# Patient Record
Sex: Female | Born: 1990 | Race: White | Hispanic: No | Marital: Single | Smoking: Current every day smoker
Health system: Southern US, Community
[De-identification: ages and names within clinical notes are randomized; demographics above are authoritative.]

## PROBLEM LIST (undated history)

## (undated) DIAGNOSIS — F32A Depression, unspecified: Secondary | ICD-10-CM

## (undated) DIAGNOSIS — E039 Hypothyroidism, unspecified: Secondary | ICD-10-CM

## (undated) DIAGNOSIS — F419 Anxiety disorder, unspecified: Secondary | ICD-10-CM

## (undated) HISTORY — DX: Depression, unspecified: F32.A

## (undated) HISTORY — DX: Hypothyroidism, unspecified: E03.9

## (undated) HISTORY — PX: WISDOM TOOTH EXTRACTION: SHX21

## (undated) HISTORY — DX: Anxiety disorder, unspecified: F41.9

---

## 2016-09-21 DIAGNOSIS — E039 Hypothyroidism, unspecified: Secondary | ICD-10-CM | POA: Insufficient documentation

## 2016-10-31 DIAGNOSIS — J302 Other seasonal allergic rhinitis: Secondary | ICD-10-CM | POA: Insufficient documentation

## 2016-10-31 DIAGNOSIS — G4700 Insomnia, unspecified: Secondary | ICD-10-CM | POA: Insufficient documentation

## 2017-04-07 DIAGNOSIS — Z720 Tobacco use: Secondary | ICD-10-CM | POA: Insufficient documentation

## 2019-01-29 DIAGNOSIS — Z6831 Body mass index (BMI) 31.0-31.9, adult: Secondary | ICD-10-CM | POA: Insufficient documentation

## 2019-01-29 HISTORY — DX: Body mass index (BMI) 31.0-31.9, adult: Z68.31

## 2020-01-30 DIAGNOSIS — E559 Vitamin D deficiency, unspecified: Secondary | ICD-10-CM | POA: Insufficient documentation

## 2020-10-22 ENCOUNTER — Other Ambulatory Visit: Payer: Self-pay

## 2020-10-22 ENCOUNTER — Emergency Department
Admission: EM | Admit: 2020-10-22 | Discharge: 2020-10-22 | Disposition: A | Discharge status: Home / Self Care | Payer: 59 | Source: Home / Self Care

## 2020-10-22 DIAGNOSIS — H9202 Otalgia, left ear: Secondary | ICD-10-CM

## 2020-10-22 DIAGNOSIS — H66002 Acute suppurative otitis media without spontaneous rupture of ear drum, left ear: Secondary | ICD-10-CM

## 2020-10-22 MED ORDER — AMOXICILLIN-POT CLAVULANATE 875-125 MG PO TABS: 1.0000 | ORAL_TABLET | Freq: Two times a day (BID) | ORAL | 0 refills | Status: AC

## 2020-10-22 NOTE — ED Triage Notes (Signed)
Patient presents to Urgent Care with complaints of left ear pain since last night. Patient reports she thinks she has an ear infection, denies changes in hearing.

## 2020-10-22 NOTE — ED Provider Notes (Signed)
[facility]   [id number] 10/22/20 Arrival Time: 7124  CC: EAR PAIN  SUBJECTIVE: History from: patient.  Haley Perez is a 30 y.o. female who presents with of left ear pain since last night. Denies a precipitating event, such as swimming or wearing ear plugs. Patient states the pain is constant and achy in character. Reports history of ear infections in the past. Patient has not taken OTC medications for this. Symptoms are made worse with lying down. Reports similar symptoms in the past. Denies fever, chills, fatigue, sinus pain, rhinorrhea, ear discharge, sore throat, SOB, wheezing, chest pain, nausea, changes in bowel or bladder habits.    ROS: As per HPI.  All other pertinent ROS negative.     History reviewed. No pertinent past medical history. History reviewed. No pertinent surgical history. No Known Allergies No current facility-administered medications on file prior to encounter.   Current Outpatient Medications on File Prior to Encounter  Medication Sig Dispense Refill  . thyroid (ARMOUR) 60 MG tablet Take 60 mg by mouth daily before breakfast. Takes 75 mg. 60mg  + 15mg  daily     Social History   Socioeconomic History  . Marital status: Single    Spouse name: Not on file  . Number of children: Not on file  . Years of education: Not on file  . Highest education level: Not on file  Occupational History  . Not on file  Tobacco Use  . Smoking status: Current Every Day Smoker    Packs/day: 0.30    Types: Cigarettes  . Smokeless tobacco: Never Used  Substance and Sexual Activity  . Alcohol use: Yes    Comment: weekly  . Drug use: Not on file  . Sexual activity: Not on file  Other Topics Concern  . Not on file  Social History Narrative  . Not on file   Social Determinants of Health   Financial Resource Strain: Not on file  Food Insecurity: Not on file  Transportation Needs: Not on file  Physical Activity: Not on file  Stress: Not on file  Social  Connections: Not on file  Intimate Partner Violence: Not on file   Family History  Problem Relation Age of Onset  . Thyroid disease Mother   . Healthy Father     OBJECTIVE:  Vitals:   10/22/20 0931  BP: 129/84  Pulse: 92  Resp: 16  Temp: 98.1 F (36.7 C)  TempSrc: Oral  SpO2: 99%     General appearance: alert; appears fatigued HEENT: Ears: EACs clear, R TM pearly gray with visible cone of light, without erythema, L TM erythematous, bulging, with effusion; Eyes: PERRL, EOMI grossly; Sinuses nontender to palpation; Nose: clear rhinorrhea; Throat: oropharynx mildly erythematous, tonsils 1+ without white tonsillar exudates, uvula midline Neck: supple without LAD Lungs: unlabored respirations, symmetrical air entry; cough: absent; no respiratory distress Heart: regular rate and rhythm.  Radial pulses 2+ symmetrical bilaterally Skin: warm and dry Psychological: alert and cooperative; normal mood and affect  Imaging: No results found.   ASSESSMENT & PLAN:  1. Non-recurrent acute suppurative otitis media of left ear without spontaneous rupture of tympanic membrane   2. Acute otalgia, left     Meds ordered this encounter  Medications  . amoxicillin-clavulanate (AUGMENTIN) 875-125 MG tablet    Sig: Take 1 tablet by mouth 2 (two) times daily for 7 days.    Dispense:  14 tablet    Refill:  0    Order Specific Question:   Supervising Provider  AnswerMerrilee Jansky [[id number]]    Rest and drink plenty of fluids Prescribed augmentin 875 BID for 7 days Take medications as directed and to completion Continue to use OTC ibuprofen and/ or tylenol as needed for pain control Follow up with PCP if symptoms persists Return here or go to the ER if you have any new or worsening symptoms   Reviewed expectations re: course of current medical issues. Questions answered. Outlined signs and symptoms indicating need for more acute intervention. Patient verbalized  understanding. After Visit Summary given.         Moshe Cipro, NP 10/22/20 [phone]

## 2020-10-22 NOTE — Discharge Instructions (Signed)
I have sent in Augmentin for you to take twice a day for 7 days.  Follow up with this office or with primary care if symptoms are persisting.  Follow up in the ER for high fever, trouble swallowing, trouble breathing, other concerning symptoms.  

## 2021-01-09 ENCOUNTER — Encounter: Payer: Self-pay | Admitting: *Deleted

## 2021-01-11 ENCOUNTER — Ambulatory Visit (INDEPENDENT_AMBULATORY_CARE_PROVIDER_SITE_OTHER): Payer: 59

## 2021-01-11 ENCOUNTER — Other Ambulatory Visit (HOSPITAL_COMMUNITY)
Admission: RE | Admit: 2021-01-11 | Discharge: 2021-01-11 | Disposition: A | Discharge status: Home / Self Care | Payer: 59 | Source: Ambulatory Visit

## 2021-01-11 ENCOUNTER — Other Ambulatory Visit: Payer: Self-pay

## 2021-01-11 VITALS — BP 116/76 | HR 93 | Wt 160.0 lb

## 2021-01-11 DIAGNOSIS — Z8659 Personal history of other mental and behavioral disorders: Secondary | ICD-10-CM

## 2021-01-11 DIAGNOSIS — Z113 Encounter for screening for infections with a predominantly sexual mode of transmission: Secondary | ICD-10-CM | POA: Diagnosis not present

## 2021-01-11 DIAGNOSIS — Z01419 Encounter for gynecological examination (general) (routine) without abnormal findings: Secondary | ICD-10-CM | POA: Insufficient documentation

## 2021-01-11 DIAGNOSIS — Z30431 Encounter for routine checking of intrauterine contraceptive device: Secondary | ICD-10-CM | POA: Diagnosis not present

## 2021-01-11 NOTE — Progress Notes (Signed)
Subjective:     Haley Perez is a 30 y.o. female here at [facility] for a well woman exam and to establish care. Current complaints: would like IUD strings checked as she nor her significant other have not been able to feel them. Says that she "had early pregnancy symptoms such as breast tenderness and fatigue. She took 2 UPT's at home, most recent this week, which were both negative. She denies abnormal vaginal bleeding or pelvic/abdominal pain. She also reports that "a mole" recently popped up "at the base of her boyfriends penis" and is concerned if it is HPV. She has not had any outbreaks or lesions or bumps. She recently moved to [place]. Personal health questionnaire reviewed: yes.  Do you have a primary care provider? no Do you feel safe at home? yes    Health Maintenance Due  Topic Date Due  . HIV Screening  Never done  . Hepatitis C Screening  Never done  . PAP-Cervical Cytology Screening  Never done  . PAP SMEAR-Modifier  Never done  . COVID-19 Vaccine (3 - Booster for Pfizer series) 05/23/2020     Risk factors for chronic health problems: No exercise routine Smoking: Yes, 1/2 PPD Alchohol/how much: social Pt BMI: There is no height or weight on file to calculate BMI.   Gynecologic History Patient's last menstrual period was 12/25/2020 (approximate). Contraception: IUD Last Pap: patient thinks was in 2018.  Results were: normal per patient Last mammogram: N/A.  Sex: No issues. Sexually active with 1 female partner.   Obstetric History OB History  Gravida Para Term Preterm AB Living  0 0 0 0 0 0  SAB IAB Ectopic Multiple Live Births  0 0 0 0 0    The following portions of the patient's history were reviewed and updated as appropriate: allergies, current medications, past family history, past medical history, past social history, past surgical history and problem list.  Review of Systems Pertinent items are noted in HPI.    Objective:   BP 116/76   Pulse  93   Wt 160 lb (72.6 kg)   LMP 12/25/2020 (Approximate)  VS reviewed, nursing note reviewed,  Constitutional: well developed, well nourished, no distress HEENT: normocephalic CV: normal rate Pulm/chest wall: normal effort Breast Exam: performed: right breast normal without mass, skin or nipple changes or axillary nodes, left breast normal without mass, skin or nipple changes or axillary nodes Abdomen: soft, non-tender Neuro: alert and oriented x 3 Skin: warm, dry Psych: affect normal Pelvic exam: Performed with chaperone present: Cervix pink, visually closed, without lesion, 2 IUD strings visualized, scant white creamy discharge, vaginal walls and external genitalia normal, pap obtained Bimanual exam: Cervix 0/long/high, firm, anterior, neg CMT, uterus nontender, nonenlarged, adnexa without tenderness, enlargement, or mass     Assessment/Plan:   1. Well woman exam - Normal well woman exam - Pap and GC/CT obtained - Full STI panel was offered but patient declined - Encouraged patient to establish care with PCP to manage anxiety and hypothyroidism. Referral placed - Smoking cessation as well incorporating healthy diet and exercise was discussed and recommended - Recommend boyfriend see PCP to look at "mole". Condom use recommended for STI prevention - Call office if any lesions or bumps appear  - Ambulatory referral to Family Practice - Cytology - PAP( [place])  2. Routine screening for STI (sexually transmitted infection)  - Cytology - PAP( Haley Perez)  3. History of anxiety - Currently on Fluoxetine daily. Reports she feels "much  better".   Follow up in: 1 year for well woman exam or as needed.   Brand Males, CNM  01/11/21 11:22 AM

## 2021-01-14 LAB — CYTOLOGY - PAP
Chlamydia: NEGATIVE
Comment: NEGATIVE
Comment: NORMAL
Diagnosis: NEGATIVE
Neisseria Gonorrhea: NEGATIVE

## 2021-01-22 DIAGNOSIS — F419 Anxiety disorder, unspecified: Secondary | ICD-10-CM | POA: Insufficient documentation

## 2021-02-11 ENCOUNTER — Ambulatory Visit (HOSPITAL_COMMUNITY): Payer: 59 | Admitting: Licensed Clinical Social Worker

## 2021-02-11 NOTE — Progress Notes (Signed)
Therapist contacted patient by My Chart for an assessment and she did not show. Session is a no show

## 2021-03-13 ENCOUNTER — Other Ambulatory Visit: Payer: Self-pay

## 2021-03-13 ENCOUNTER — Encounter: Payer: Self-pay | Admitting: Nurse Practitioner

## 2021-03-13 ENCOUNTER — Ambulatory Visit: Payer: 59 | Admitting: Nurse Practitioner

## 2021-03-13 VITALS — BP 110/66 | HR 93 | Temp 98.2°F | Ht 61.8 in | Wt 163.6 lb

## 2021-03-13 DIAGNOSIS — R5383 Other fatigue: Secondary | ICD-10-CM | POA: Diagnosis not present

## 2021-03-13 DIAGNOSIS — K5909 Other constipation: Secondary | ICD-10-CM

## 2021-03-13 DIAGNOSIS — E559 Vitamin D deficiency, unspecified: Secondary | ICD-10-CM | POA: Diagnosis not present

## 2021-03-13 DIAGNOSIS — K648 Other hemorrhoids: Secondary | ICD-10-CM

## 2021-03-13 DIAGNOSIS — G4700 Insomnia, unspecified: Secondary | ICD-10-CM | POA: Diagnosis not present

## 2021-03-13 DIAGNOSIS — Z114 Encounter for screening for human immunodeficiency virus [HIV]: Secondary | ICD-10-CM

## 2021-03-13 DIAGNOSIS — E039 Hypothyroidism, unspecified: Secondary | ICD-10-CM

## 2021-03-13 MED ORDER — BELSOMRA 5 MG PO TABS: 1.0000 | ORAL_TABLET | Freq: Every evening | ORAL | 3 refills | Status: DC | PRN

## 2021-03-13 NOTE — Progress Notes (Signed)
I,Katawbba Wiggins,acting as a Neurosurgeon for [employer], FNP.,have documented all relevant documentation on the behalf of Arnette Felts, FNP,as directed by  Arnette Felts, FNP while in the presence of Arnette Felts, FNP.   This visit occurred during the SARS-CoV-2 public health emergency.  Safety protocols were in place, including screening questions prior to the visit, additional usage of staff PPE, and extensive cleaning of exam room while observing appropriate contact time as indicated for disinfecting solutions.  Subjective:     Patient ID: Haley Perez , female    DOB: 1991-08-22 , 30 y.o.   MRN: [id number]   Chief Complaint  Patient presents with   Hypothyroidism   Establish Care   Anxiety   Insomnia   Hemorrhoids    HPI  The patient is here today for an evaluation on insomnia, anxiety, hypothyroidism, and hemorrhoids.  She relocated here from [facility] in the Fall 2021, she is originally from [place].  She works for an [employer].  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 Lives with her boyfriend. No children.    She has been on fluoxetine since May  She gained 60 lbs in a year with the thyroid issues. She is taking her thyroid medications by itself.   She has been sleeping and staying in bed at least 10 hours a day and is taking naps, she generally has problems sleeping. She does drink caffeine in the mornings at least 2 cups.  She is also having discomfort to her low abdomen area. Was to see a GI specialist but relocated.  She has also seen Camelia Eng at [facility]  Trazadone, restoril,   Anxiety Patient reports no palpitations.    Thyroid Problem Presents for initial visit. Symptoms include constipation, diarrhea (generally loose), dry skin and fatigue. Patient reports no cold intolerance, palpitations, weight gain or weight loss.    Past Medical History:  Diagnosis Date   Anxiety      Family History  Problem Relation Age  of Onset   Thyroid disease Mother    Healthy Father      Current Outpatient Medications:    ALPRAZolam (XANAX) 0.25 MG tablet, Take 0.25 mg by mouth daily as needed., Disp: , Rfl:    FLUoxetine (PROZAC) 20 MG capsule, Take 1 capsule by mouth daily., Disp: , Rfl:    Levonorgestrel (SKYLA) 13.5 MG IUD, , Disp: , Rfl:    Suvorexant (BELSOMRA) 5 MG TABS, Take 1 tablet by mouth at bedtime as needed., Disp: 30 tablet, Rfl: 3   thyroid (ARMOUR) 15 MG tablet, Take 1 tablet by mouth daily., Disp: , Rfl:    thyroid (ARMOUR) 60 MG tablet, Take 60 mg by mouth daily before breakfast. Takes 75 mg. 60mg  + 15mg  daily, Disp: , Rfl:    Lemborexant (DAYVIGO) 5 MG TABS, Take 1 tablet by mouth at bedtime., Disp: 30 tablet, Rfl: 2   No Known Allergies   Review of Systems  Constitutional:  Positive for fatigue. Negative for weight gain and weight loss.  Respiratory: Negative.    Cardiovascular: Negative.  Negative for palpitations.  Gastrointestinal:  Positive for constipation and diarrhea (generally loose).  Endocrine: Negative for cold intolerance.  Psychiatric/Behavioral: Negative.         She is sleeping a lot.     Today's Vitals   03/13/21 1448  BP: 110/66  Pulse: 93  Temp: 98.2 F (36.8 C)  TempSrc: Oral  Weight: 163 lb 9.6 oz (74.2 kg)  Height: 5' 1.8" (1.57  m)   Body mass index is 30.12 kg/m.  Wt Readings from Last 3 Encounters:  03/13/21 163 lb 9.6 oz (74.2 kg)  01/11/21 160 lb (72.6 kg)    BP Readings from Last 3 Encounters:  03/13/21 110/66  01/11/21 116/76  10/22/20 129/84    Objective:  Physical Exam Vitals reviewed.  Constitutional:      General: She is not in acute distress.    Appearance: Normal appearance.  Cardiovascular:     Rate and Rhythm: Normal rate and regular rhythm.     Pulses: Normal pulses.     Heart sounds: Normal heart sounds. No murmur heard. Pulmonary:     Effort: Pulmonary effort is normal. No respiratory distress.     Breath sounds: Normal breath  sounds. No wheezing.  Abdominal:     General: Abdomen is flat. Bowel sounds are normal. There is no distension.     Palpations: Abdomen is soft.     Tenderness: There is no abdominal tenderness.  Skin:    General: Skin is warm and dry.     Capillary Refill: Capillary refill takes less than 2 seconds.  Neurological:     General: No focal deficit present.     Mental Status: She is alert and oriented to person, place, and time.     Cranial Nerves: No cranial nerve deficit.     Motor: No weakness.  Psychiatric:        Mood and Affect: Mood normal.        Behavior: Behavior normal.        Thought Content: Thought content normal.        Judgment: Judgment normal.        Assessment And Plan:     1. Other fatigue Comments: will check for metabolic causes - VITAMIN D 25 Hydroxy (Vit-D Deficiency, Fractures) - Vitamin B12 - CBC - Iron, TIBC and Ferritin Panel  2. Acquired hypothyroidism Comments: Will check thyroid functions Will make changes to medications as necessary pending lab results - TSH - T4 - T3, free  3. Vitamin D deficiency Will check vitamin D level and supplement as needed.    Also encouraged to spend 15 minutes in the sun daily.   4. Insomnia, unspecified type Will try her on belsomra Discussed side effects and encouraged her to take when she is going to have at least 8 hours of sleep - Suvorexant (BELSOMRA) 5 MG TABS; Take 1 tablet by mouth at bedtime as needed.  Dispense: 30 tablet; Refill: 3  5. Other hemorrhoids None currently encouraged to use suppository as needed She is to also increase her water intake and eat more fiber to avoid constipation  6. Other constipation A high fiber diet with plenty of fluids (up to 8 glasses of water daily) is suggested to relieve these symptoms.  Metamucil, 1 tablespoon once or twice daily can be used to keep bowels regular if needed.  7. Encounter for HIV (human immunodeficiency virus) test - HIV Antibody (routine  testing w rflx)    Patient was given opportunity to ask questions. Patient verbalized understanding of the plan and was able to repeat key elements of the plan. All questions were answered to their satisfaction.  Arnette Felts, FNP    I, Arnette Felts, FNP, have reviewed all documentation for this visit. The documentation on 03/13/21 for the exam, diagnosis, procedures, and orders are all accurate and complete.  IF YOU HAVE BEEN REFERRED TO A SPECIALIST, IT MAY TAKE 1-2 WEEKS TO  SCHEDULE/PROCESS THE REFERRAL. IF YOU HAVE NOT HEARD FROM US/SPECIALIST IN TWO WEEKS, PLEASE GIVE [place] A CALL AT [phone].   THE PATIENT IS ENCOURAGED TO PRACTICE SOCIAL DISTANCING DUE TO THE COVID-19 PANDEMIC.

## 2021-03-14 LAB — VITAMIN D 25 HYDROXY (VIT D DEFICIENCY, FRACTURES): Vit D, 25-Hydroxy: 50.1 ng/mL (ref 30.0–100.0)

## 2021-03-14 LAB — CBC
Hematocrit: 41.2 % (ref 34.0–46.6)
Hemoglobin: 14.1 g/dL (ref 11.1–15.9)
MCH: 31.7 pg (ref 26.6–33.0)
MCHC: 34.2 g/dL (ref 31.5–35.7)
MCV: 93 fL (ref 79–97)
Platelets: 287 10*3/uL (ref 150–450)
RBC: 4.45 x10E6/uL (ref 3.77–5.28)
RDW: 11.7 % (ref 11.7–15.4)
WBC: 8.5 10*3/uL (ref 3.4–10.8)

## 2021-03-14 LAB — IRON,TIBC AND FERRITIN PANEL
Ferritin: 131 ng/mL (ref 15–150)
Iron Saturation: 39 % (ref 15–55)
Iron: 111 ug/dL (ref 27–159)
Total Iron Binding Capacity: 282 ug/dL (ref 250–450)
UIBC: 171 ug/dL (ref 131–425)

## 2021-03-14 LAB — VITAMIN B12: Vitamin B-12: 482 pg/mL (ref 232–1245)

## 2021-03-14 LAB — TSH: TSH: 1.74 u[IU]/mL (ref 0.450–4.500)

## 2021-03-14 LAB — T3, FREE: T3, Free: 3.5 pg/mL (ref 2.0–4.4)

## 2021-03-14 LAB — T4: T4, Total: 5.9 ug/dL (ref 4.5–12.0)

## 2021-03-14 LAB — HIV ANTIBODY (ROUTINE TESTING W REFLEX): HIV Screen 4th Generation wRfx: NONREACTIVE

## 2021-03-26 ENCOUNTER — Other Ambulatory Visit: Payer: Self-pay

## 2021-03-26 MED ORDER — DAYVIGO 5 MG PO TABS: 1.0000 | ORAL_TABLET | Freq: Every day | ORAL | 2 refills | Status: DC

## 2021-07-16 ENCOUNTER — Ambulatory Visit: Payer: 59 | Admitting: Nurse Practitioner

## 2021-07-16 ENCOUNTER — Other Ambulatory Visit: Payer: Self-pay

## 2021-07-16 ENCOUNTER — Encounter: Payer: Self-pay | Admitting: Nurse Practitioner

## 2021-07-16 VITALS — BP 110/80 | HR 94 | Temp 98.1°F | Ht 61.0 in | Wt 165.6 lb

## 2021-07-16 DIAGNOSIS — Z23 Encounter for immunization: Secondary | ICD-10-CM | POA: Diagnosis not present

## 2021-07-16 DIAGNOSIS — E039 Hypothyroidism, unspecified: Secondary | ICD-10-CM | POA: Diagnosis not present

## 2021-07-16 DIAGNOSIS — E559 Vitamin D deficiency, unspecified: Secondary | ICD-10-CM | POA: Diagnosis not present

## 2021-07-16 DIAGNOSIS — R1030 Lower abdominal pain, unspecified: Secondary | ICD-10-CM

## 2021-07-16 LAB — POCT URINALYSIS DIPSTICK
Bilirubin, UA: NEGATIVE
Blood, UA: NEGATIVE
Glucose, UA: NEGATIVE
Nitrite, UA: NEGATIVE
Protein, UA: NEGATIVE
Spec Grav, UA: 1.02 (ref 1.010–1.025)
Urobilinogen, UA: 0.2 E.U./dL
pH, UA: 5.5 (ref 5.0–8.0)

## 2021-07-16 LAB — POCT URINE PREGNANCY: Preg Test, Ur: NEGATIVE

## 2021-07-16 MED ORDER — THYROID 60 MG PO TABS
60.0000 mg | ORAL_TABLET | Freq: Every day | ORAL | 1 refills | Status: DC
Start: 2021-07-16 — End: 2021-12-25

## 2021-07-16 MED ORDER — THYROID 15 MG PO TABS: 15.0000 mg | ORAL_TABLET | Freq: Every day | ORAL | 1 refills | Status: DC

## 2021-07-16 NOTE — Progress Notes (Signed)
I,Haley Perez,acting as a Education administrator for Haley Brine, FNP.,have documented all relevant documentation on the behalf of Haley Brine, FNP,as directed by  Haley Brine, FNP while in the presence of Haley Perez, [place].   This visit occurred during the SARS-CoV-2 public health emergency.  Safety protocols were in place, including screening questions prior to the visit, additional usage of staff PPE, and extensive cleaning of exam room while observing appropriate contact time as indicated for disinfecting solutions.  Subjective:     Patient ID: Haley Perez , female    DOB: Jan 11, 1991 , 30 y.o.   MRN: [id number]   Chief Complaint  Patient presents with   Abdominal Pain     HPI  She reports a dull pain on her right side for more than one week, she states it comes & goes. She noticed this earlier this month. Coughing or sneezing it becomes more of a sharp pain. She does sit all day at work, sitting for too long it does bother her.  When she rotates her hip she has more pain, when she flexes her muscles. She has been taking miralax due to thinking she was constipated. She has an IUD last seen by GYN was earlier this year to check the strings. Heating pad and ibuprofen has been effective. She does have her appendix.     Past Medical History:  Diagnosis Date   Anxiety      Family History  Problem Relation Age of Onset   Thyroid disease Mother    Healthy Father      Current Outpatient Medications:    ALPRAZolam (XANAX) 0.25 MG tablet, Take 0.25 mg by mouth daily as needed., Disp: , Rfl:    Levonorgestrel (SKYLA) 13.5 MG IUD, , Disp: , Rfl:    FLUoxetine (PROZAC) 20 MG capsule, Take 1 capsule by mouth daily. (Patient not taking: Reported on 07/16/2021), Disp: , Rfl:    Lemborexant (DAYVIGO) 5 MG TABS, Take 1 tablet by mouth at bedtime. (Patient not taking: Reported on 07/16/2021), Disp: 30 tablet, Rfl: 2   Suvorexant (BELSOMRA) 5 MG TABS, Take 1 tablet by mouth at bedtime as needed.  (Patient not taking: Reported on 07/16/2021), Disp: 30 tablet, Rfl: 3   thyroid (ARMOUR) 15 MG tablet, Take 1 tablet (15 mg total) by mouth daily., Disp: 90 tablet, Rfl: 1   thyroid (ARMOUR) 60 MG tablet, Take 1 tablet (60 mg total) by mouth daily before breakfast. Takes 75 mg. 69m + 184mdaily, Disp: 90 tablet, Rfl: 1   No Known Allergies   Review of Systems  Constitutional: Negative.   Respiratory: Negative.    Cardiovascular: Negative.   Gastrointestinal:  Positive for abdominal pain (right lower quadrant) and nausea. Negative for constipation and vomiting.  Genitourinary:  Negative for difficulty urinating, dysuria, enuresis, flank pain, frequency and vaginal bleeding.  Neurological: Negative.   Psychiatric/Behavioral: Negative.      Today's Vitals   07/16/21 1425  BP: 110/80  Pulse: 94  Temp: 98.1 F (36.7 C)  Weight: 165 lb 9.6 oz (75.1 kg)  Height: 5' 1"  (1.549 m)  PainSc: 0-No pain   Body mass index is 31.29 kg/m.  Wt Readings from Last 3 Encounters:  07/16/21 165 lb 9.6 oz (75.1 kg)  03/13/21 163 lb 9.6 oz (74.2 kg)  01/11/21 160 lb (72.6 kg)    Objective:  Physical Exam Vitals reviewed.  Constitutional:      General: She is not in acute distress.    Appearance: Normal appearance.  Cardiovascular:     Rate and Rhythm: Normal rate and regular rhythm.     Pulses: Normal pulses.     Heart sounds: Normal heart sounds. No murmur heard. Pulmonary:     Effort: Pulmonary effort is normal. No respiratory distress.     Breath sounds: Normal breath sounds. No wheezing.  Abdominal:     General: Abdomen is flat. Bowel sounds are normal. There is no distension.     Palpations: Abdomen is soft. There is no mass.     Tenderness: There is abdominal tenderness (right lower quadrant). There is no right CVA tenderness, left CVA tenderness or guarding.  Neurological:     General: No focal deficit present.     Mental Status: She is alert and oriented to person, place, and time.      Cranial Nerves: No cranial nerve deficit.     Motor: No weakness.        Assessment And Plan:     1. Pain radiating to lower abdomen Comments: tenderness to right lower abdomen near groin area, will check transvaginal and pelvic ultrasound to check her ovaries. Negative urine pregnancy - POCT Urinalysis Dipstick (94709) - POCT Urine Pregnancy - US Pelvic Complete With Transvaginal; Future - CBC - CMP14+EGFR  2. Acquired hypothyroidism Comments: Will check thyroid levels, refill sent will make changes pending labs - TSH - T4 - T3, free - thyroid (ARMOUR) 60 MG tablet; Take 1 tablet (60 mg total) by mouth daily before breakfast. Takes 75 mg. 40m + 155mdaily  Dispense: 90 tablet; Refill: 1 - thyroid (ARMOUR) 15 MG tablet; Take 1 tablet (15 mg total) by mouth daily.  Dispense: 90 tablet; Refill: 1  3. Vitamin D deficiency Will check vitamin D level and supplement as needed.    Also encouraged to spend 15 minutes in the sun daily.   4. Encounter for immunization Influenza vaccine administered Encouraged to take Tylenol as needed for fever or muscle aches. - Flu Vaccine QUAD 6+ mos PF IM (Fluarix Quad PF)    Patient was given opportunity to ask questions. Patient verbalized understanding of the plan and was able to repeat key elements of the plan. All questions were answered to their satisfaction.  [name]   I, [name], have reviewed all documentation for this visit. The documentation on 07/16/21 for the exam, diagnosis, procedures, and orders are all accurate and complete.   IF YOU HAVE BEEN REFERRED TO A SPECIALIST, IT MAY TAKE 1-2 WEEKS TO SCHEDULE/PROCESS THE REFERRAL. IF YOU HAVE NOT HEARD FROM US/SPECIALIST IN TWO WEEKS, PLEASE GIVE [place] CALL AT [phone] X [phone].   THE PATIENT IS ENCOURAGED TO PRACTICE SOCIAL DISTANCING DUE TO THE COVID-19 PANDEMIC.

## 2021-07-17 LAB — CMP14+EGFR
ALT: 45 IU/L — ABNORMAL HIGH (ref 0–32)
AST: 28 IU/L (ref 0–40)
Albumin/Globulin Ratio: 2 (ref 1.2–2.2)
Albumin: 5.2 g/dL — ABNORMAL HIGH (ref 3.9–5.0)
Alkaline Phosphatase: 94 IU/L (ref 44–121)
BUN/Creatinine Ratio: 13 (ref 9–23)
BUN: 9 mg/dL (ref 6–20)
Bilirubin Total: 1 mg/dL (ref 0.0–1.2)
CO2: 17 mmol/L — ABNORMAL LOW (ref 20–29)
Calcium: 10 mg/dL (ref 8.7–10.2)
Chloride: 102 mmol/L (ref 96–106)
Creatinine, Ser: 0.67 mg/dL (ref 0.57–1.00)
Globulin, Total: 2.6 g/dL (ref 1.5–4.5)
Glucose: 69 mg/dL — ABNORMAL LOW (ref 70–99)
Potassium: 3.7 mmol/L (ref 3.5–5.2)
Sodium: 141 mmol/L (ref 134–144)
Total Protein: 7.8 g/dL (ref 6.0–8.5)
eGFR: 123 mL/min/{1.73_m2} (ref >59)

## 2021-07-17 LAB — CBC
Hematocrit: 45.4 % (ref 34.0–46.6)
Hemoglobin: 15.3 g/dL (ref 11.1–15.9)
MCH: 31.3 pg (ref 26.6–33.0)
MCHC: 33.7 g/dL (ref 31.5–35.7)
MCV: 93 fL (ref 79–97)
Platelets: 264 10*3/uL (ref 150–450)
RBC: 4.89 x10E6/uL (ref 3.77–5.28)
RDW: 12.1 % (ref 11.7–15.4)
WBC: 10 10*3/uL (ref 3.4–10.8)

## 2021-07-17 LAB — T3, FREE: T3, Free: 3 pg/mL (ref 2.0–4.4)

## 2021-07-17 LAB — T4: T4, Total: 6 ug/dL (ref 4.5–12.0)

## 2021-07-17 LAB — TSH: TSH: 5.29 u[IU]/mL — ABNORMAL HIGH (ref 0.450–4.500)

## 2021-07-22 ENCOUNTER — Ambulatory Visit
Admission: RE | Admit: 2021-07-22 | Discharge: 2021-07-22 | Disposition: A | Discharge status: Home / Self Care | Payer: 59 | Source: Ambulatory Visit | Attending: Nurse Practitioner | Admitting: Nurse Practitioner

## 2021-07-22 DIAGNOSIS — R1030 Lower abdominal pain, unspecified: Secondary | ICD-10-CM

## 2021-10-10 ENCOUNTER — Other Ambulatory Visit: Payer: Self-pay

## 2021-10-10 ENCOUNTER — Encounter: Payer: Self-pay | Admitting: Nurse Practitioner

## 2021-10-10 ENCOUNTER — Ambulatory Visit (INDEPENDENT_AMBULATORY_CARE_PROVIDER_SITE_OTHER): Payer: 59 | Admitting: Nurse Practitioner

## 2021-10-10 VITALS — BP 114/66 | HR 55 | Temp 99.1°F | Ht 61.0 in | Wt 176.4 lb

## 2021-10-10 DIAGNOSIS — Z0000 Encounter for general adult medical examination without abnormal findings: Secondary | ICD-10-CM | POA: Diagnosis not present

## 2021-10-10 DIAGNOSIS — Z79899 Other long term (current) drug therapy: Secondary | ICD-10-CM

## 2021-10-10 DIAGNOSIS — E6609 Other obesity due to excess calories: Secondary | ICD-10-CM

## 2021-10-10 DIAGNOSIS — Z124 Encounter for screening for malignant neoplasm of cervix: Secondary | ICD-10-CM

## 2021-10-10 DIAGNOSIS — N926 Irregular menstruation, unspecified: Secondary | ICD-10-CM

## 2021-10-10 DIAGNOSIS — F419 Anxiety disorder, unspecified: Secondary | ICD-10-CM | POA: Diagnosis not present

## 2021-10-10 DIAGNOSIS — F5101 Primary insomnia: Secondary | ICD-10-CM | POA: Diagnosis not present

## 2021-10-10 DIAGNOSIS — E039 Hypothyroidism, unspecified: Secondary | ICD-10-CM

## 2021-10-10 DIAGNOSIS — E559 Vitamin D deficiency, unspecified: Secondary | ICD-10-CM | POA: Diagnosis not present

## 2021-10-10 DIAGNOSIS — Z6833 Body mass index (BMI) 33.0-33.9, adult: Secondary | ICD-10-CM

## 2021-10-10 DIAGNOSIS — Z01419 Encounter for gynecological examination (general) (routine) without abnormal findings: Secondary | ICD-10-CM

## 2021-10-10 MED ORDER — BELSOMRA 10 MG PO TABS: 1.0000 | ORAL_TABLET | Freq: Every day | ORAL | 2 refills | Status: DC | PRN

## 2021-10-10 NOTE — Patient Instructions (Signed)

## 2021-10-10 NOTE — Progress Notes (Signed)
I,Tianna Badgett,acting as a Education administrator for [employer], FNP.,have documented all relevant documentation on the behalf of Haley Brine, FNP,as directed by  Haley Brine, FNP while in the presence of Haley Perez, [place].  This visit occurred during the SARS-CoV-2 public health emergency.  Safety protocols were in place, including screening questions prior to the visit, additional usage of staff PPE, and extensive cleaning of exam room while observing appropriate contact time as indicated for disinfecting solutions.  Subjective:     Patient ID: Haley Perez , female    DOB: 09/16/1990 , 31 y.o.   MRN: [id number]   Chief Complaint  Patient presents with   Annual Exam    HPI  Here for HM. Overall is doing well.     Past Medical History:  Diagnosis Date   Anxiety      Family History  Problem Relation Age of Onset   Thyroid disease Mother    Healthy Father      Current Outpatient Medications:    ALPRAZolam (XANAX) 0.25 MG tablet, Take 0.25 mg by mouth daily as needed., Disp: , Rfl:    Levonorgestrel (SKYLA) 13.5 MG IUD, , Disp: , Rfl:    Suvorexant (BELSOMRA) 10 MG TABS, Take 1 tablet by mouth daily as needed., Disp: 30 tablet, Rfl: 2   thyroid (ARMOUR) 15 MG tablet, Take 1 tablet (15 mg total) by mouth daily., Disp: 90 tablet, Rfl: 1   thyroid (ARMOUR) 60 MG tablet, Take 1 tablet (60 mg total) by mouth daily before breakfast. Takes 75 mg. 62m + 11mdaily, Disp: 90 tablet, Rfl: 1   FLUoxetine (PROZAC) 20 MG capsule, Take 1 capsule by mouth daily. (Patient not taking: Reported on 10/10/2021), Disp: , Rfl:    No Known Allergies    The patient states she uses an IUD for birth control.  Patient's last menstrual period was 09/23/2021 (approximate).. Negative for Dysmenorrhea and Negative for Menorrhagia. Negative for: breast discharge, breast lump(s), breast pain and breast self exam. Associated symptoms include abnormal vaginal bleeding. Pertinent negatives include abnormal bleeding  (hematology), anxiety, decreased libido, depression, difficulty falling sleep, dyspareunia, history of infertility, nocturia, sexual dysfunction, sleep disturbances, urinary incontinence, urinary urgency, vaginal discharge and vaginal itching. Diet regular. The patient states her exercise level is minimal.   The patient's tobacco use is:  Social History   Tobacco Use  Smoking Status Every Day   Packs/day: 0.50   Years: 5.00   Pack years: 2.50   Types: Cigarettes  Smokeless Tobacco Never   She has been exposed to passive smoke. The patient's alcohol use is:  Social History   Substance and Sexual Activity  Alcohol Use Yes   Comment: socially every couple weeks   Additional information: Last pap 01/11/2021, next one scheduled for 2025.    Review of Systems  Constitutional: Negative.   HENT: Negative.    Eyes: Negative.   Respiratory: Negative.    Cardiovascular: Negative.   Gastrointestinal: Negative.   Endocrine: Negative.   Genitourinary: Negative.   Musculoskeletal: Negative.   Skin: Negative.   Allergic/Immunologic: Negative.   Neurological: Negative.   Hematological: Negative.   Psychiatric/Behavioral: Negative.      Today's Vitals   10/10/21 1048  BP: 114/66  Pulse: (!) 55  Temp: 99.1 F (37.3 C)  Weight: 176 lb 6.4 oz (80 kg)  Height: 5' 1" (1.549 m)   Body mass index is 33.33 kg/m.  Wt Readings from Last 3 Encounters:  10/10/21 176 lb 6.4 oz (80 kg)  07/16/21 165 lb 9.6 oz (75.1 kg)  03/13/21 163 lb 9.6 oz (74.2 kg)    BP Readings from Last 3 Encounters:  10/10/21 114/66  07/16/21 110/80  03/13/21 110/66    Objective:  Physical Exam Constitutional:      General: She is not in acute distress.    Appearance: Normal appearance. She is well-developed. She is obese.  HENT:     Head: Normocephalic and atraumatic.     Right Ear: Hearing, tympanic membrane, ear canal and external ear normal. There is no impacted cerumen.     Left Ear: Hearing, tympanic  membrane, ear canal and external ear normal. There is no impacted cerumen.     Nose:     Comments: Deferred - masked    Mouth/Throat:     Comments: Deferred - masked Eyes:     General: Lids are normal.     Extraocular Movements: Extraocular movements intact.     Conjunctiva/sclera: Conjunctivae normal.     Pupils: Pupils are equal, round, and reactive to light.     Funduscopic exam:    Right eye: No papilledema.        Left eye: No papilledema.  Neck:     Thyroid: No thyroid mass.     Vascular: No carotid bruit.  Cardiovascular:     Rate and Rhythm: Normal rate and regular rhythm.     Pulses: Normal pulses.     Heart sounds: Normal heart sounds. No murmur heard. Pulmonary:     Effort: Pulmonary effort is normal.     Breath sounds: Normal breath sounds.  Chest:     Chest wall: No mass.  Breasts:    Tanner Score is 5.     Right: Normal. No mass or tenderness.     Left: Normal. No mass or tenderness.  Abdominal:     General: Abdomen is flat. Bowel sounds are normal. There is no distension.     Palpations: Abdomen is soft.     Tenderness: There is no abdominal tenderness.  Genitourinary:    Comments: Deferred - will refer to GYN  Musculoskeletal:        General: No swelling. Normal range of motion.     Cervical back: Full passive range of motion without pain, normal range of motion and neck supple.     Right lower leg: No edema.     Left lower leg: No edema.  Lymphadenopathy:     Upper Body:     Right upper body: No supraclavicular, axillary or pectoral adenopathy.     Left upper body: No supraclavicular, axillary or pectoral adenopathy.  Skin:    General: Skin is warm and dry.     Capillary Refill: Capillary refill takes less than 2 seconds.  Neurological:     General: No focal deficit present.     Mental Status: She is alert and oriented to person, place, and time.     Cranial Nerves: No cranial nerve deficit.     Sensory: No sensory deficit.  Psychiatric:         Mood and Affect: Mood normal.        Behavior: Behavior normal.        Thought Content: Thought content normal.        Judgment: Judgment normal.        Assessment And Plan:     1. Encounter for annual physical exam Behavior modifications discussed and diet history reviewed.   Pt will continue to exercise regularly and  modify diet with low GI, plant based foods and decrease intake of processed foods.  Recommend intake of daily multivitamin, Vitamin D, and calcium.  Recommend for preventive screenings,  discussed monthly self breast exams as well as recommend immunizations that include influenza, TDAP (up to date) - Hemoglobin A1c - CMP14+EGFR - Lipid panel  2. Encounter for gynecological examination - Ambulatory referral to Obstetrics / Gynecology  3. Class 1 obesity due to excess calories without serious comorbidity with body mass index (BMI) of 33.0 to 33.9 in adult Chronic Discussed healthy diet and regular exercise options  Encouraged to exercise at least 150 minutes per week with 2 days of strength training  4. Acquired hypothyroidism Continue current medications pending labs. - TSH - T3, free - T4  5. Vitamin D deficiency Will check vitamin D level and supplement as needed.    Also encouraged to spend 15 minutes in the sun daily.  - VITAMIN D 25 Hydroxy (Vit-D Deficiency, Fractures)  6. Anxiety Comments: Stable, no concerns at this time  7. Other long term (current) drug therapy - CBC  8. Abnormal menses Comments: She has an IUD, was slightly lighter last month - POCT Urine Pregnancy  9. Primary insomnia Comments: She has not picked up Belsomra, samples were given and Rx sent to pharmacy.  - Suvorexant (BELSOMRA) 10 MG TABS; Take 1 tablet by mouth daily as needed.  Dispense: 30 tablet; Refill: 2 She is encouraged to strive for BMI less than 30 to decrease cardiac risk. Advised to aim for at least 150 minutes of exercise per week.     Patient was given  opportunity to ask questions. Patient verbalized understanding of the plan and was able to repeat key elements of the plan. All questions were answered to their satisfaction.   Haley Brine, FNP   I, Haley Brine, FNP, have reviewed all documentation for this visit. The documentation on 10/10/21 for the exam, diagnosis, procedures, and orders are all accurate and complete.  THE PATIENT IS ENCOURAGED TO PRACTICE SOCIAL DISTANCING DUE TO THE COVID-19 PANDEMIC.

## 2021-10-11 LAB — CMP14+EGFR
ALT: 51 IU/L — ABNORMAL HIGH (ref 0–32)
AST: 25 IU/L (ref 0–40)
Albumin/Globulin Ratio: 2.2 (ref 1.2–2.2)
Albumin: 5 g/dL (ref 3.9–5.0)
Alkaline Phosphatase: 94 IU/L (ref 44–121)
BUN/Creatinine Ratio: 18 (ref 9–23)
BUN: 13 mg/dL (ref 6–20)
Bilirubin Total: 0.4 mg/dL (ref 0.0–1.2)
CO2: 19 mmol/L — ABNORMAL LOW (ref 20–29)
Calcium: 9.8 mg/dL (ref 8.7–10.2)
Chloride: 103 mmol/L (ref 96–106)
Creatinine, Ser: 0.71 mg/dL (ref 0.57–1.00)
Globulin, Total: 2.3 g/dL (ref 1.5–4.5)
Glucose: 74 mg/dL (ref 70–99)
Potassium: 4.3 mmol/L (ref 3.5–5.2)
Sodium: 141 mmol/L (ref 134–144)
Total Protein: 7.3 g/dL (ref 6.0–8.5)
eGFR: 119 mL/min/{1.73_m2} (ref >59)

## 2021-10-11 LAB — CBC
Hematocrit: 45.9 % (ref 34.0–46.6)
Hemoglobin: 15.7 g/dL (ref 11.1–15.9)
MCH: 32.1 pg (ref 26.6–33.0)
MCHC: 34.2 g/dL (ref 31.5–35.7)
MCV: 94 fL (ref 79–97)
Platelets: 301 10*3/uL (ref 150–450)
RBC: 4.89 x10E6/uL (ref 3.77–5.28)
RDW: 11.9 % (ref 11.7–15.4)
WBC: 11.7 10*3/uL — ABNORMAL HIGH (ref 3.4–10.8)

## 2021-10-11 LAB — LIPID PANEL
Chol/HDL Ratio: 3.1 ratio (ref 0.0–4.4)
Cholesterol, Total: 191 mg/dL (ref 100–199)
HDL: 61 mg/dL (ref >39)
LDL Chol Calc (NIH): 115 mg/dL — ABNORMAL HIGH (ref 0–99)
Triglycerides: 85 mg/dL (ref 0–149)
VLDL Cholesterol Cal: 15 mg/dL (ref 5–40)

## 2021-10-11 LAB — T3, FREE: T3, Free: 4.9 pg/mL — ABNORMAL HIGH (ref 2.0–4.4)

## 2021-10-11 LAB — VITAMIN D 25 HYDROXY (VIT D DEFICIENCY, FRACTURES): Vit D, 25-Hydroxy: 33.4 ng/mL (ref 30.0–100.0)

## 2021-10-11 LAB — T4: T4, Total: 5.6 ug/dL (ref 4.5–12.0)

## 2021-10-11 LAB — HEMOGLOBIN A1C
Est. average glucose Bld gHb Est-mCnc: 103 mg/dL
Hgb A1c MFr Bld: 5.2 % (ref 4.8–5.6)

## 2021-10-11 LAB — TSH: TSH: 7.68 u[IU]/mL — ABNORMAL HIGH (ref 0.450–4.500)

## 2021-10-14 ENCOUNTER — Other Ambulatory Visit: Payer: Self-pay | Admitting: Nurse Practitioner

## 2021-10-14 DIAGNOSIS — E039 Hypothyroidism, unspecified: Secondary | ICD-10-CM

## 2021-10-22 ENCOUNTER — Other Ambulatory Visit: Payer: Self-pay

## 2021-10-22 ENCOUNTER — Encounter: Payer: Self-pay | Admitting: Emergency Medicine

## 2021-10-22 ENCOUNTER — Ambulatory Visit
Admission: EM | Admit: 2021-10-22 | Discharge: 2021-10-22 | Disposition: A | Discharge status: Home / Self Care | Payer: 59 | Attending: Physician Assistant | Admitting: Physician Assistant

## 2021-10-22 DIAGNOSIS — H9202 Otalgia, left ear: Secondary | ICD-10-CM | POA: Diagnosis not present

## 2021-10-22 MED ORDER — AMOXICILLIN 500 MG PO CAPS: 500.0000 mg | ORAL_CAPSULE | Freq: Three times a day (TID) | ORAL | 0 refills | Status: DC

## 2021-10-22 NOTE — ED Triage Notes (Signed)
Patient c/o left ear pain since this am.  No other sx's.  Patient denies any other sx's.

## 2021-10-22 NOTE — ED Provider Notes (Signed)
Haley Perez    CSN: [id number] Arrival date & time: 10/22/21  0858      History   Chief Complaint Chief Complaint  Patient presents with   Otalgia    HPI Haley Perez is a 31 y.o. female.   Patient here today for evaluation of left ear pain that started this morning.  She states it feels as if her eardrum has already "busted".  She denies any other symptoms.  She has not had any fever.  She denies any pain in her right ear.  She does not report treatment for symptoms.  The history is provided by the patient.  Otalgia Associated symptoms: no congestion, no cough, no diarrhea, no fever, no sore throat and no vomiting    Past Medical History:  Diagnosis Date   Anxiety     Patient Active Problem List   Diagnosis Date Noted   Anxiety 01/22/2021   BMI 31.0-31.9,adult 01/29/2019   Insomnia 10/31/2016   Hypothyroidism (acquired) 09/21/2016    Past Surgical History:  Procedure Laterality Date   WISDOM TOOTH EXTRACTION      OB History     Gravida  0   Para  0   Term  0   Preterm  0   AB  0   Living  0      SAB  0   IAB  0   Ectopic  0   Multiple  0   Live Births  0            Home Medications    Prior to Admission medications   Medication Sig Start Date End Date Taking? Authorizing Provider  ALPRAZolam (XANAX) 0.25 MG tablet Take 0.25 mg by mouth daily as needed. 12/06/20  Yes [provider]  amoxicillin (AMOXIL) 500 MG capsule Take 1 capsule (500 mg total) by mouth 3 (three) times daily. 10/22/21  Yes Tomi Bamberger, PA-C  Levonorgestrel (SKYLA) 13.5 MG IUD  10/23/16  Yes [provider]  Suvorexant (BELSOMRA) 10 MG TABS Take 1 tablet by mouth daily as needed. 10/10/21  Yes Arnette Felts, FNP  thyroid (ARMOUR) 15 MG tablet Take 1 tablet (15 mg total) by mouth daily. 07/16/21  Yes Arnette Felts, FNP  thyroid (ARMOUR) 60 MG tablet Take 1 tablet (60 mg total) by mouth daily before breakfast. Takes 75 mg. 60mg  + 15mg   daily 07/16/21  Yes , FNP  FLUoxetine (PROZAC) 20 MG capsule Take 1 capsule by mouth daily. Patient not taking: Reported on 10/10/2021 12/07/20 12/26/21  [provider]    Family History Family History  Problem Relation Age of Onset   Thyroid disease Mother    Healthy Father     Social History Social History   Tobacco Use   Smoking status: Every Day    Packs/day: 0.50    Years: 5.00    Pack years: 2.50    Types: Cigarettes   Smokeless tobacco: Never  Vaping Use   Vaping Use: Never used  Substance Use Topics   Alcohol use: Yes    Comment: socially every couple weeks   Drug use: Never     Allergies   Patient has no known allergies.   Review of Systems Review of Systems  Constitutional:  Negative for chills and fever.  HENT:  Positive for ear pain. Negative for congestion and sore throat.   Eyes:  Negative for discharge and redness.  Respiratory:  Negative for cough and shortness of breath.   Gastrointestinal:  Negative for diarrhea, nausea and vomiting.    Physical Exam Triage Vital Signs ED Triage Vitals [10/22/21 1021]  Enc Vitals Group     BP 109/71     Pulse Rate 92     Resp      Temp 98.3 F (36.8 C)     Temp Source Oral     SpO2 98 %     Weight 170 lb (77.1 kg)     Height 5' (1.524 m)     Head Circumference      Peak Flow      Pain Score 4     Pain Loc      Pain Edu?      Excl. in GC?    No data found.  Updated Vital Signs BP 109/71 (BP Location: Left Arm)    Pulse 92    Temp 98.3 F (36.8 C) (Oral)    Ht 5' (1.524 m)    Wt 170 lb (77.1 kg)    LMP 09/23/2021 (Approximate)    SpO2 98%    BMI 33.20 kg/m   Visual Acuity Right Eye Distance:   Left Eye Distance:   Bilateral Distance:    Right Eye Near:   Left Eye Near:    Bilateral Near:     Physical Exam Vitals and nursing note reviewed.  Constitutional:      General: She is not in acute distress.    Appearance: Normal appearance. She is not ill-appearing.  HENT:      Head: Normocephalic and atraumatic.     Right Ear: Tympanic membrane normal.     Ears:     Comments: Unable to fully visualize left TM due to small amount of cerumen in EAC    Nose: Congestion present.  Eyes:     Conjunctiva/sclera: Conjunctivae normal.  Cardiovascular:     Rate and Rhythm: Normal rate.  Pulmonary:     Effort: Pulmonary effort is normal. No respiratory distress.  Skin:    General: Skin is warm and dry.  Neurological:     Mental Status: She is alert.  Psychiatric:        Mood and Affect: Mood normal.        Thought Content: Thought content normal.     UC Treatments / Results  Labs (all labs ordered are listed, but only abnormal results are displayed) Labs Reviewed - No data to display  EKG   Radiology No results found.  Procedures Procedures (including critical Perez time)  Medications Ordered in UC Medications - No data to display  Initial Impression / Assessment and Plan / UC Course  I have reviewed the triage vital signs and the nursing notes.  Pertinent labs & imaging results that were available during my Perez of the patient were reviewed by me and considered in my medical decision making (see chart for details).  Amoxicillin prescribed for left ear pain given inability to fully visualize TM.  Recommended follow-up if symptoms fail to improve or worsen.  Patient expresses understanding.   Final Clinical Impressions(s) / UC Diagnoses   Final diagnoses:  Left ear pain   Discharge Instructions   None    ED Prescriptions     Medication Sig Dispense Auth. Provider   amoxicillin (AMOXIL) 500 MG capsule Take 1 capsule (500 mg total) by mouth 3 (three) times daily. 21 capsule Tomi Bamberger, PA-C      PDMP not reviewed this encounter.   Tomi Bamberger, PA-C 10/22/21 1103

## 2021-10-30 ENCOUNTER — Encounter: Payer: Self-pay | Admitting: Nurse Practitioner

## 2021-10-31 ENCOUNTER — Other Ambulatory Visit: Payer: Self-pay | Admitting: Nurse Practitioner

## 2021-10-31 DIAGNOSIS — R232 Flushing: Secondary | ICD-10-CM

## 2021-11-04 ENCOUNTER — Other Ambulatory Visit: Payer: Self-pay

## 2021-11-04 ENCOUNTER — Other Ambulatory Visit: Payer: 59

## 2021-11-04 DIAGNOSIS — E039 Hypothyroidism, unspecified: Secondary | ICD-10-CM

## 2021-11-04 DIAGNOSIS — R232 Flushing: Secondary | ICD-10-CM

## 2021-11-12 LAB — ESTRADIOL, FREE
Estradiol, Serum, MS: 37 pg/mL
Free Estradiol, Percent: 2.1 %
Free Estradiol, Serum: 0.78 pg/mL

## 2021-11-12 LAB — T3, FREE: T3, Free: 6.1 pg/mL — ABNORMAL HIGH (ref 2.0–4.4)

## 2021-11-12 LAB — TSH: TSH: 3.05 u[IU]/mL (ref 0.450–4.500)

## 2021-11-12 LAB — T4: T4, Total: 7.2 ug/dL (ref 4.5–12.0)

## 2021-11-15 ENCOUNTER — Other Ambulatory Visit: Payer: Self-pay

## 2021-11-15 NOTE — Telephone Encounter (Signed)
Prior auth done for Belsomra 10mg , waiting on a response from the [employer].  ?

## 2021-11-16 ENCOUNTER — Other Ambulatory Visit: Payer: Self-pay | Admitting: Nurse Practitioner

## 2021-12-24 ENCOUNTER — Encounter: Payer: Self-pay | Admitting: Nurse Practitioner

## 2021-12-24 ENCOUNTER — Ambulatory Visit: Payer: 59 | Admitting: Nurse Practitioner

## 2021-12-24 VITALS — BP 128/70 | HR 94 | Temp 98.4°F | Ht 60.0 in | Wt 168.0 lb

## 2021-12-24 DIAGNOSIS — E6609 Other obesity due to excess calories: Secondary | ICD-10-CM

## 2021-12-24 DIAGNOSIS — R232 Flushing: Secondary | ICD-10-CM | POA: Diagnosis not present

## 2021-12-24 DIAGNOSIS — R7989 Other specified abnormal findings of blood chemistry: Secondary | ICD-10-CM

## 2021-12-24 DIAGNOSIS — E039 Hypothyroidism, unspecified: Secondary | ICD-10-CM

## 2021-12-24 DIAGNOSIS — Z6832 Body mass index (BMI) 32.0-32.9, adult: Secondary | ICD-10-CM

## 2021-12-24 NOTE — Patient Instructions (Signed)

## 2021-12-24 NOTE — Progress Notes (Signed)
I,Tianna Badgett,acting as a Neurosurgeon for [employer], FNP.,have documented all relevant documentation on the behalf of Arnette Felts, FNP,as directed by  Arnette Felts, FNP while in the presence of Arnette Felts, FNP.  This visit occurred during the SARS-CoV-2 public health emergency.  Safety protocols were in place, including screening questions prior to the visit, additional usage of staff PPE, and extensive cleaning of exam room while observing appropriate contact time as indicated for disinfecting solutions.  Subjective:     Patient ID: Haley Perez , female    DOB: 12-May-1991 , 31 y.o.   MRN: [id number]   Chief Complaint  Patient presents with   Thyroid Problem    HPI  Pt presents today for a follow up on thyroid meds. She has been on thyroid armour since prior to coming to this office and thinks she needs to switch to synthroid. She was on levothyroxine which caused more side effects. She is taking 75 mg M-F and skipping weekend  Thyroid Problem Presents for follow-up visit. Symptoms include fatigue. Patient reports no anxiety. (Hot flashes)    Past Medical History:  Diagnosis Date   Anxiety    Hypothyroid      Family History  Problem Relation Age of Onset   Thyroid disease Mother    Healthy Father      Current Outpatient Medications:    ALPRAZolam (XANAX) 0.25 MG tablet, Take 0.25 mg by mouth daily as needed., Disp: , Rfl:    DAYVIGO 5 MG TABS, TAKE 1 TABLET BY MOUTH EVERY DAY AT BEDTIME, Disp: 30 tablet, Rfl: 5   FLUoxetine (PROZAC) 20 MG capsule, Take 1 capsule by mouth daily., Disp: , Rfl:    Levonorgestrel (SKYLA) 13.5 MG IUD, , Disp: , Rfl:    SYNTHROID 88 MCG tablet, Take 1 tablet (88 mcg total) by mouth daily., Disp: 30 tablet, Rfl: 2   No Known Allergies   Review of Systems  Constitutional:  Positive for fatigue.  Respiratory: Negative.    Cardiovascular: Negative.   Neurological: Negative.   Psychiatric/Behavioral: Negative.  The patient is not  nervous/anxious.     Today's Vitals   12/24/21 1546  BP: 128/70  Pulse: 94  Temp: 98.4 F (36.9 C)  TempSrc: Oral  Weight: 168 lb (76.2 kg)  Height: 5' (1.524 m)   Body mass index is 32.81 kg/m.  Wt Readings from Last 3 Encounters:  01/16/22 169 lb (76.7 kg)  12/24/21 168 lb (76.2 kg)  10/22/21 170 lb (77.1 kg)    Objective:  Physical Exam Vitals reviewed.  Constitutional:      General: She is not in acute distress.    Appearance: Normal appearance. She is obese.  Cardiovascular:     Rate and Rhythm: Normal rate and regular rhythm.     Pulses: Normal pulses.     Heart sounds: Normal heart sounds. No murmur heard. Pulmonary:     Effort: Pulmonary effort is normal. No respiratory distress.     Breath sounds: Normal breath sounds. No wheezing.  Musculoskeletal:     Cervical back: Normal range of motion and neck supple. No rigidity or tenderness.  Lymphadenopathy:     Cervical: No cervical adenopathy.  Skin:    General: Skin is warm and dry.     Capillary Refill: Capillary refill takes less than 2 seconds.  Neurological:     General: No focal deficit present.     Mental Status: She is alert and oriented to person, place, and time.  Cranial Nerves: No cranial nerve deficit.     Motor: No weakness.  Psychiatric:        Mood and Affect: Mood normal.        Behavior: Behavior normal.        Thought Content: Thought content normal.        Judgment: Judgment normal.        Assessment And Plan:     1. Acquired hypothyroidism Comments: will consider switching to synthroid pending labs. Samples of 88 mcg synthroid given in event we change  - T4 - T3, free - TSH - US THYROID; Future  2. Hot flashes Comments: May be related to her thyroid will check thyroid levels.   3. Abnormal thyroid blood test Comments: Will send for thyroid ultrasound. No noted palpable nodules - US THYROID; Future  4. Class 1 obesity due to excess calories with serious comorbidity and  body mass index (BMI) of 32.0 to 32.9 in adult  She is encouraged to strive for BMI less than 30 to decrease cardiac risk. Advised to aim for at least 150 minutes of exercise per week.    Patient was given opportunity to ask questions. Patient verbalized understanding of the plan and was able to repeat key elements of the plan. All questions were answered to their satisfaction.  Arnette Felts, FNP   I, Arnette Felts, FNP, have reviewed all documentation for this visit. The documentation on 12/24/21 for the exam, diagnosis, procedures, and orders are all accurate and complete.   IF YOU HAVE BEEN REFERRED TO A SPECIALIST, IT MAY TAKE 1-2 WEEKS TO SCHEDULE/PROCESS THE REFERRAL. IF YOU HAVE NOT HEARD FROM US/SPECIALIST IN TWO WEEKS, PLEASE GIVE [place] A CALL AT [phone] X [phone].   THE PATIENT IS ENCOURAGED TO PRACTICE SOCIAL DISTANCING DUE TO THE COVID-19 PANDEMIC.

## 2021-12-25 ENCOUNTER — Other Ambulatory Visit: Payer: Self-pay | Admitting: Nurse Practitioner

## 2021-12-25 DIAGNOSIS — E039 Hypothyroidism, unspecified: Secondary | ICD-10-CM

## 2021-12-25 LAB — T3, FREE: T3, Free: 3.8 pg/mL (ref 2.0–4.4)

## 2021-12-25 LAB — TSH: TSH: 8.91 u[IU]/mL — ABNORMAL HIGH (ref 0.450–4.500)

## 2021-12-25 LAB — T4: T4, Total: 6.5 ug/dL (ref 4.5–12.0)

## 2021-12-25 MED ORDER — SYNTHROID 88 MCG PO TABS: 88.0000 ug | ORAL_TABLET | Freq: Every day | ORAL | 2 refills | Status: DC

## 2022-01-06 ENCOUNTER — Ambulatory Visit
Admission: RE | Admit: 2022-01-06 | Discharge: 2022-01-06 | Disposition: A | Discharge status: Home / Self Care | Payer: 59 | Source: Ambulatory Visit | Attending: Nurse Practitioner | Admitting: Nurse Practitioner

## 2022-01-06 DIAGNOSIS — R7989 Other specified abnormal findings of blood chemistry: Secondary | ICD-10-CM

## 2022-01-06 DIAGNOSIS — E039 Hypothyroidism, unspecified: Secondary | ICD-10-CM

## 2022-01-09 NOTE — Progress Notes (Signed)
ANNUAL EXAM Patient name: Haley Perez MRN [id number]  Date of birth: [birth year] Chief Complaint:   Annual Exam  History of Present Illness:   Haley Perez is a 31 y.o. G0P0000 female being seen today for a routine annual exam.   Current complaints:   She has some RLQ pain. Has [facility] of endometriosis - no known personal history. Has been going on since getting covid. She has regular BM. No urinary issues. She had a normal [place] in November.  Questions about conceiving in s/p hypothyroidism. Considering TTC in the next year.  One month ago felt like a rubber band snapping during intercourse - was painful. Hasn't felt that since.   Had covid in August/Sept and hasn't felt like usual since.   No LMP recorded. (Menstrual status: IUD).   The pregnancy intention screening data noted above was reviewed. Potential methods of contraception were discussed. The patient elected to proceed with No data recorded.   Last pap 12/2020. Results were: NILM w/ HRHPV not done. H/O abnormal pap: no Health Maintenance Due  Topic Date Due   COVID-19 Vaccine (3 - Pfizer risk series) 01/19/2020        03/13/2021    2:43 PM  Depression screen PHQ 2/9  Decreased Interest 0  Down, Depressed, Hopeless 0  PHQ - 2 Score 0         View : No data to display.           Review of Systems:   Pertinent items are noted in HPI Denies any headaches, blurred vision, fatigue, shortness of breath, chest pain,  abnormal vaginal discharge/itching/odor/irritation, problems with periods, bowel movements, urination, unless otherwise stated above.  Pertinent History Reviewed:  Reviewed past medical,surgical, social and family history.  Reviewed problem list, medications and allergies. Physical Assessment:   Vitals:   01/16/22 0920  BP: 115/80  Pulse: 88  Weight: 169 lb (76.7 kg)  Height: 5' (1.524 m)  Body mass index is 33.01 kg/m.   Physical Examination:  General appearance - well appearing, and in no  distress Mental status - alert, oriented to person, place, and time Psych:  She has a normal mood and affect Skin - warm and dry, normal color, no suspicious lesions noted Chest - effort normal, all lung fields clear to auscultation bilaterally Heart - normal rate and regular rhythm Neck:  midline trachea, no thyromegaly or nodules Breasts - breasts appear normal, no suspicious masses, no skin or nipple changes or  axillary nodes Abdomen - soft, nontender, nondistended, no masses or organomegaly Pelvic -  VULVA: normal appearing vulva with no masses, tenderness or lesions   VAGINA: normal appearing vagina with normal color and discharge, no lesions . Some mild left obturator tenderness CERVIX: normal appearing cervix without discharge or lesions, no CMT, IUD strings visualized.  UTERUS: uterus is felt to be normal size, shape, consistency and nontender  ADNEXA: No adnexal masses or tenderness noted. Extremities:  No swelling or varicosities noted  Chaperone present for exam  No results found for this or any previous visit (from the past 24 hour(s)).  Assessment & Plan:  Neddie was seen today for annual exam.  Diagnoses and all orders for this visit:  Encounter for annual routine gynecological examination - Cervical cancer screening: Discussed screening Q3 years. Reviewed importance of annual exams and limits of pap smear. Pap with reflex HPV normal 12/2020. Due 2025.  - GC/CT: Discussed and recommended. Pt  declines - Birth Control: Continued IUD (skyla) - Breast  Health: Encouraged self breast awareness/exams. Teaching provided. - Follow-up: 12 months and prn  Pelvic floor tension - Discussed exercises would work well for obturator  Encounter for preconception consultation - Current birth control:  IUD - Medical problems:  Hypothyroidism - reviewed impact on fertility, sab, and fetal development and that most risks are mitigated by having good control of thyroid prior to  conception. Reviewed goal TSH of 2.5.  - Concerning social habits: None  - Medications reviewed:  May continue routine meds for now but would not take xanax during the pregnancy.  If still taking the Dayvigo, would discontinue this.  - If not pregnant after 12 months of trying, she should come in for evaluation or 6 months if associated with severe dysmenorrhea  Abdominal pain - Pain is on the right and around the umbilicus. She had normal [place]. Reviewed most likely MSK vs bowel vs possible endometriosis given [facility]. Nevertheless all would give normal [place] and would not advise any other intervention at this time as long as pain remains mild. Reviewed if once IUD out and TTC, if she has severe dysmenorrhea, then dx laparoscopy would be an option after 6 mo of TTC  No orders of the defined types were placed in this encounter.   Meds: No orders of the defined types were placed in this encounter.   Follow-up: Return in about 1 year (around 01/17/2023) for annual.  Milas Hock, MD 01/16/2022 10:44 AM

## 2022-01-16 ENCOUNTER — Encounter: Payer: Self-pay | Admitting: Obstetrics and Gynecology

## 2022-01-16 ENCOUNTER — Ambulatory Visit (INDEPENDENT_AMBULATORY_CARE_PROVIDER_SITE_OTHER): Payer: 59 | Admitting: Obstetrics and Gynecology

## 2022-01-16 VITALS — BP 115/80 | HR 88 | Ht 60.0 in | Wt 169.0 lb

## 2022-01-16 DIAGNOSIS — M6289 Other specified disorders of muscle: Secondary | ICD-10-CM | POA: Diagnosis not present

## 2022-01-16 DIAGNOSIS — Z3169 Encounter for other general counseling and advice on procreation: Secondary | ICD-10-CM

## 2022-01-16 DIAGNOSIS — Z01419 Encounter for gynecological examination (general) (routine) without abnormal findings: Secondary | ICD-10-CM | POA: Diagnosis not present

## 2022-01-16 DIAGNOSIS — R1033 Periumbilical pain: Secondary | ICD-10-CM | POA: Diagnosis not present

## 2022-01-16 NOTE — Patient Instructions (Signed)
The muscle that was tender on the left was the obturator muscle.

## 2022-01-16 NOTE — Progress Notes (Signed)
Pt c/o right side pelvic pain Pt may want to get pregnant soon and has questions concerning hypothyroidism

## 2022-01-21 ENCOUNTER — Other Ambulatory Visit: Payer: Self-pay | Admitting: Nurse Practitioner

## 2022-01-21 DIAGNOSIS — R9389 Abnormal findings on diagnostic imaging of other specified body structures: Secondary | ICD-10-CM

## 2022-01-21 DIAGNOSIS — E039 Hypothyroidism, unspecified: Secondary | ICD-10-CM

## 2022-01-21 DIAGNOSIS — R232 Flushing: Secondary | ICD-10-CM

## 2022-01-30 ENCOUNTER — Other Ambulatory Visit: Payer: Self-pay | Admitting: Nurse Practitioner

## 2022-01-30 ENCOUNTER — Other Ambulatory Visit: Payer: 59

## 2022-01-30 DIAGNOSIS — E039 Hypothyroidism, unspecified: Secondary | ICD-10-CM

## 2022-01-31 LAB — T3, FREE: T3, Free: 2.5 pg/mL (ref 2.0–4.4)

## 2022-01-31 LAB — TSH: TSH: 5.15 u[IU]/mL — ABNORMAL HIGH (ref 0.450–4.500)

## 2022-01-31 LAB — T4: T4, Total: 7.1 ug/dL (ref 4.5–12.0)

## 2022-02-09 ENCOUNTER — Other Ambulatory Visit: Payer: Self-pay | Admitting: Nurse Practitioner

## 2022-02-09 DIAGNOSIS — E039 Hypothyroidism, unspecified: Secondary | ICD-10-CM

## 2022-02-10 ENCOUNTER — Other Ambulatory Visit: Payer: Self-pay | Admitting: Nurse Practitioner

## 2022-02-10 DIAGNOSIS — E039 Hypothyroidism, unspecified: Secondary | ICD-10-CM

## 2022-02-10 MED ORDER — LEVOTHYROXINE SODIUM 88 MCG PO TABS: ORAL_TABLET | ORAL | 2 refills | Status: DC

## 2022-03-26 ENCOUNTER — Telehealth: Payer: Self-pay | Admitting: *Deleted

## 2022-03-26 NOTE — Telephone Encounter (Signed)
Mailbox full

## 2022-04-10 ENCOUNTER — Ambulatory Visit: Payer: 59 | Admitting: Nurse Practitioner

## 2022-04-10 ENCOUNTER — Ambulatory Visit
Admission: RE | Admit: 2022-04-10 | Discharge: 2022-04-10 | Disposition: A | Discharge status: Home / Self Care | Payer: 59 | Source: Ambulatory Visit | Attending: Nurse Practitioner | Admitting: Nurse Practitioner

## 2022-04-10 ENCOUNTER — Encounter: Payer: Self-pay | Admitting: Nurse Practitioner

## 2022-04-10 VITALS — BP 118/66 | HR 101 | Temp 98.7°F | Ht 60.0 in | Wt 167.0 lb

## 2022-04-10 DIAGNOSIS — F17200 Nicotine dependence, unspecified, uncomplicated: Secondary | ICD-10-CM | POA: Diagnosis not present

## 2022-04-10 DIAGNOSIS — E039 Hypothyroidism, unspecified: Secondary | ICD-10-CM | POA: Diagnosis not present

## 2022-04-10 DIAGNOSIS — Z6832 Body mass index (BMI) 32.0-32.9, adult: Secondary | ICD-10-CM

## 2022-04-10 DIAGNOSIS — E559 Vitamin D deficiency, unspecified: Secondary | ICD-10-CM

## 2022-04-10 DIAGNOSIS — E6609 Other obesity due to excess calories: Secondary | ICD-10-CM

## 2022-04-10 DIAGNOSIS — E539 Vitamin B deficiency, unspecified: Secondary | ICD-10-CM

## 2022-04-10 NOTE — Progress Notes (Addendum)
I,Tianna Badgett,acting as a Neurosurgeon for [employer], FNP.,have documented all relevant documentation on the behalf of Arnette Felts, FNP,as directed by  Arnette Felts, FNP while in the presence of Arnette Felts, FNP.  Subjective:     Patient ID: Haley Perez , female    DOB: 06/05/1991 , 31 y.o.   MRN: [id number]   Chief Complaint  Patient presents with   Thyroid Problem    HPI  Pt presents today for a follow up on thyroid meds. She has been training for scuba diving and on Wednesday was in the pool and afterwards felt fatigued. Has pulled herself from the class to ensure she was not sick. Took 2 covid test on Friday and Sunday. Now just feeling "drained" otherwise good. The class is 2 weeks. Next class is September 5th.   Thyroid Problem Presents for follow-up visit. Symptoms include tremors. Patient reports no anxiety, cold intolerance or dry skin. (Hot flashes and will have nausea when she gets overheated. Had improved but has gotten worse. )     Past Medical History:  Diagnosis Date   Anxiety    Hypothyroid      Family History  Problem Relation Age of Onset   Thyroid disease Mother    Healthy Father      Current Outpatient Medications:    ALPRAZolam (XANAX) 0.25 MG tablet, Take 0.25 mg by mouth daily as needed., Disp: , Rfl:    Levonorgestrel (SKYLA) 13.5 MG IUD, , Disp: , Rfl:    levothyroxine (SYNTHROID) 88 MCG tablet, Take 1 tablet by mouth daily Mon- Saturday and 2 tabs on Sunday, Disp: 30 tablet, Rfl: 2   No Known Allergies   Review of Systems  Constitutional: Negative.   Respiratory: Negative.    Cardiovascular: Negative.   Gastrointestinal: Negative.   Endocrine: Negative for cold intolerance.  Neurological:  Positive for tremors.  Psychiatric/Behavioral: Negative.  The patient is not nervous/anxious.      Today's Vitals   04/10/22 0909  BP: 118/66  Pulse: (!) 101  Temp: 98.7 F (37.1 C)  TempSrc: Oral  Weight: 167 lb (75.8 kg)  Height: 5' (1.524 m)    Body mass index is 32.61 kg/m.  Wt Readings from Last 3 Encounters:  04/10/22 167 lb (75.8 kg)  01/16/22 169 lb (76.7 kg)  12/24/21 168 lb (76.2 kg)    Objective:  Physical Exam Vitals reviewed.  Constitutional:      General: She is not in acute distress.    Appearance: Normal appearance. She is obese.  Cardiovascular:     Rate and Rhythm: Normal rate and regular rhythm.     Pulses: Normal pulses.     Heart sounds: Normal heart sounds. No murmur heard. Pulmonary:     Effort: Pulmonary effort is normal. No respiratory distress.     Breath sounds: Normal breath sounds. No wheezing.  Musculoskeletal:     Cervical back: Normal range of motion and neck supple. No rigidity or tenderness.  Lymphadenopathy:     Cervical: No cervical adenopathy.  Skin:    General: Skin is warm and dry.     Capillary Refill: Capillary refill takes less than 2 seconds.  Neurological:     General: No focal deficit present.     Mental Status: She is alert and oriented to person, place, and time.     Cranial Nerves: No cranial nerve deficit.     Motor: No weakness.  Psychiatric:        Mood and Affect: Mood  normal.        Behavior: Behavior normal.        Thought Content: Thought content normal.        Judgment: Judgment normal.         Assessment And Plan:     1. Acquired hypothyroidism Comments: TSH was slightly elevated and is taking levothyroxine 88 mcg Mon-Sat and 2 tabs Sunday. Will recheck thyroid levels - T3, free - T4 - TSH  2. Vitamin D deficiency Will check vitamin D level and supplement as needed.    Also encouraged to spend 15 minutes in the sun daily.  - VITAMIN D 25 Hydroxy (Vit-D Deficiency, Fractures)  3. Vitamin B deficiency Comments: Per patient had low vitamin B12 will check levels reports having some fatigue - Vitamin B12  4. Smoker Comments: Will refer for Pulmonary for pulmonary functions to be cleared for scuba diving certification. Smoking approximately 6  cigarettes a day.  - DG Chest 2 View; Future - Ambulatory referral to Pulmonology  5. Class 1 obesity due to excess calories with serious comorbidity and body mass index (BMI) of 32.0 to 32.9 in adult She is encouraged to strive for BMI less than 30 to decrease cardiac risk. Advised to aim for at least 150 minutes of exercise per week.    Patient was given opportunity to ask questions. Patient verbalized understanding of the plan and was able to repeat key elements of the plan. All questions were answered to their satisfaction.  Arnette Felts, FNP   I, Arnette Felts, FNP, have reviewed all documentation for this visit. The documentation on 04/10/22 for the exam, diagnosis, procedures, and orders are all accurate and complete.   IF YOU HAVE BEEN REFERRED TO A SPECIALIST, IT MAY TAKE 1-2 WEEKS TO SCHEDULE/PROCESS THE REFERRAL. IF YOU HAVE NOT HEARD FROM US/SPECIALIST IN TWO WEEKS, PLEASE GIVE [place] A CALL AT [phone].   THE PATIENT IS ENCOURAGED TO PRACTICE SOCIAL DISTANCING DUE TO THE COVID-19 PANDEMIC.

## 2022-04-10 NOTE — Patient Instructions (Signed)

## 2022-04-11 LAB — VITAMIN D 25 HYDROXY (VIT D DEFICIENCY, FRACTURES): Vit D, 25-Hydroxy: 29.4 ng/mL — ABNORMAL LOW (ref 30.0–100.0)

## 2022-04-11 LAB — T3, FREE: T3, Free: 2.8 pg/mL (ref 2.0–4.4)

## 2022-04-11 LAB — T4: T4, Total: 7.1 ug/dL (ref 4.5–12.0)

## 2022-04-11 LAB — VITAMIN B12: Vitamin B-12: 713 pg/mL (ref 232–1245)

## 2022-04-11 LAB — TSH: TSH: 11.3 u[IU]/mL — ABNORMAL HIGH (ref 0.450–4.500)

## 2022-04-14 ENCOUNTER — Other Ambulatory Visit: Payer: Self-pay | Admitting: Nurse Practitioner

## 2022-04-14 DIAGNOSIS — E039 Hypothyroidism, unspecified: Secondary | ICD-10-CM

## 2022-04-14 MED ORDER — LEVOTHYROXINE SODIUM 100 MCG PO TABS: ORAL_TABLET | ORAL | 1 refills | Status: DC

## 2022-05-19 ENCOUNTER — Other Ambulatory Visit (HOSPITAL_COMMUNITY)
Admission: RE | Admit: 2022-05-19 | Discharge: 2022-05-19 | Disposition: A | Discharge status: Home / Self Care | Payer: 59 | Source: Ambulatory Visit | Attending: Obstetrics and Gynecology | Admitting: Obstetrics and Gynecology

## 2022-05-19 ENCOUNTER — Encounter: Payer: Self-pay | Admitting: Obstetrics and Gynecology

## 2022-05-19 ENCOUNTER — Ambulatory Visit: Payer: 59 | Admitting: Obstetrics and Gynecology

## 2022-05-19 VITALS — BP 122/80 | HR 98 | Ht 60.0 in

## 2022-05-19 DIAGNOSIS — Z113 Encounter for screening for infections with a predominantly sexual mode of transmission: Secondary | ICD-10-CM | POA: Insufficient documentation

## 2022-05-19 DIAGNOSIS — Z30433 Encounter for removal and reinsertion of intrauterine contraceptive device: Secondary | ICD-10-CM | POA: Diagnosis not present

## 2022-05-19 DIAGNOSIS — Z30430 Encounter for insertion of intrauterine contraceptive device: Secondary | ICD-10-CM

## 2022-05-19 LAB — POCT URINE PREGNANCY: Preg Test, Ur: NEGATIVE

## 2022-05-19 MED ORDER — LEVONORGESTREL 20 MCG/DAY IU IUD
1.0000 | INTRAUTERINE_SYSTEM | Freq: Once | INTRAUTERINE | Status: AC
  Administered 2022-05-19: 1 via INTRAUTERINE

## 2022-05-19 NOTE — Progress Notes (Signed)
     IUD REMOVAL & INSERTION PROCEDURE NOTE  Haley Perez is a 31 y.o. G0P0000 here for Skyla removal and Mirena insertion. No GYN concerns.   She was counseled regarding the risks/benefits of IUD including insertion risk of infection, hemorrhage, damage to surrounding tissue and organs, uterine perforation. She was counseled regarding risks of IUD including implantation into uterine wall, malpositioning, misplacement out of the uterus, migration outside of uterus, possible need for hysteroscopic or laparoscopic removal, expulsion. She was advised that risk of pregnancy is low with negative UPT but is not zero and IUD insertion may cause miscarriage. Reviewed that she is also at slightly higher risk for ectopic pregnancy and she should take a pregnancy test if she believes she may be pregnant. She was advised to use backup method of protection for one week. She verbalized understanding of all of the above and consent signed.   Last pap smear was on 01/2021 and was normal  IUD Removal and Insertion  Patient identified and an adequate time out was performed. Patient with normal appearing external female genitalia. Graves speculum placed in vagina and white IUD strings easily visualized. Strings grasped with ring forceps and removed easily. Minimal bleeding noted.   The cervix was cleaned with Betadine x 2 and grasped anteriorly with a single tooth tenaculum.  A uterine sound was used to sound the uterus to 7 cm;  the IUD was then placed per manufacturer's recommendations. Strings trimmed to 3 cm. Tenaculum was removed, good hemostasis noted with pressure. Patient tolerated procedure well.   Patient was given post-procedure instructions. Patient was also asked to check IUD strings periodically and follow up in 4 weeks for IUD check.  Mirena IUD Exp: 10/24/2023 Lot: Nanetta Batty, M.D. Attending Center for [employer] Fish farm manager)

## 2022-05-21 LAB — CERVICOVAGINAL ANCILLARY ONLY
Chlamydia: NEGATIVE
Comment: NEGATIVE
Comment: NORMAL
Neisseria Gonorrhea: NEGATIVE

## 2022-05-23 ENCOUNTER — Other Ambulatory Visit: Payer: Self-pay

## 2022-05-23 DIAGNOSIS — R0602 Shortness of breath: Secondary | ICD-10-CM

## 2022-05-26 ENCOUNTER — Ambulatory Visit (INDEPENDENT_AMBULATORY_CARE_PROVIDER_SITE_OTHER): Payer: 59 | Admitting: Internal Medicine

## 2022-05-26 DIAGNOSIS — R0602 Shortness of breath: Secondary | ICD-10-CM

## 2022-05-26 LAB — PULMONARY FUNCTION TEST
DL/VA % pred: 88 %
DL/VA: 4.16 ml/min/mmHg/L
DLCO cor % pred: 99 %
DLCO cor: 20.85 ml/min/mmHg
DLCO unc % pred: 99 %
DLCO unc: 20.85 ml/min/mmHg
FEF 25-75 Post: 3.74 L/sec
FEF 25-75 Pre: 2.89 L/sec
FEF2575-%Change-Post: 29 %
FEF2575-%Pred-Post: 109 %
FEF2575-%Pred-Pre: 84 %
FEV1-%Change-Post: 7 %
FEV1-%Pred-Post: 110 %
FEV1-%Pred-Pre: 103 %
FEV1-Post: 3.35 L
FEV1-Pre: 3.13 L
FEV1FVC-%Change-Post: 6 %
FEV1FVC-%Pred-Pre: 94 %
FEV6-%Change-Post: 1 %
FEV6-%Pred-Post: 111 %
FEV6-%Pred-Pre: 110 %
FEV6-Post: 3.92 L
FEV6-Pre: 3.88 L
FEV6FVC-%Pred-Post: 100 %
FEV6FVC-%Pred-Pre: 100 %
FVC-%Change-Post: 1 %
FVC-%Pred-Post: 110 %
FVC-%Pred-Pre: 109 %
FVC-Post: 3.92 L
FVC-Pre: 3.88 L
Post FEV1/FVC ratio: 85 %
Post FEV6/FVC ratio: 100 %
Pre FEV1/FVC ratio: 81 %
Pre FEV6/FVC Ratio: 100 %
RV % pred: 120 %
RV: 1.5 L
TLC % pred: 114 %
TLC: 5.44 L

## 2022-05-26 NOTE — Progress Notes (Signed)
Full PFT Performed Today  

## 2022-05-26 NOTE — Patient Instructions (Signed)
Full PFT Performed Today  

## 2022-06-03 ENCOUNTER — Encounter: Payer: Self-pay | Admitting: Nurse Practitioner

## 2022-06-05 ENCOUNTER — Other Ambulatory Visit: Payer: Self-pay | Admitting: Nurse Practitioner

## 2022-06-05 DIAGNOSIS — E039 Hypothyroidism, unspecified: Secondary | ICD-10-CM

## 2022-06-17 ENCOUNTER — Encounter: Payer: Self-pay | Admitting: Obstetrics and Gynecology

## 2022-06-17 ENCOUNTER — Ambulatory Visit (INDEPENDENT_AMBULATORY_CARE_PROVIDER_SITE_OTHER): Payer: 59 | Admitting: Obstetrics and Gynecology

## 2022-06-17 VITALS — BP 121/81 | HR 101 | Ht 60.0 in | Wt 164.0 lb

## 2022-06-17 DIAGNOSIS — Z30431 Encounter for routine checking of intrauterine contraceptive device: Secondary | ICD-10-CM

## 2022-06-17 NOTE — Progress Notes (Signed)
    GYNECOLOGY OFFICE ENCOUNTER NOTE  History:  31 y.o. G0P0000 here today for today for IUD string check; Mirena  IUD was placed  05/19/2022. No complaints about the IUD, no concerning side effects.  The following portions of the patient's history were reviewed and updated as appropriate: allergies, current medications, past family history, past medical history, past social history, past surgical history and problem list. Last pap smear on 01/11/21 was normal, negative HRHPV.  Review of Systems:  Pertinent items are noted in HPI.   Objective:  Physical Exam Blood pressure 121/81, pulse (!) 101, height 5' (1.524 m), weight 164 lb (74.4 kg). CONSTITUTIONAL: Well-developed, well-nourished female in no acute distress.  NEUROLOGIC: Alert and oriented to person, place, and time. Normal reflexes, muscle tone coordination.  PSYCHIATRIC: Normal mood and affect. Normal behavior. Normal judgment and thought content. CARDIOVASCULAR: Normal heart rate noted RESPIRATORY: Effort and breath sounds normal, no problems with respiration noted ABDOMEN: Soft, no distention noted.   PELVIC: Normal appearing external genitalia; normal appearing vaginal mucosa and cervix.  IUD strings visualized, about 2 cm in length outside cervix. Done in the presence of a chaperone.   Assessment & Plan:  Patient to keep IUD in place for up to eight years; can come in for removal earlier if she desires or for any concerning side effects. Recommended condoms for STI prevention.   Terryon Pineiro, Artist Pais, [place] for [employer], [place]

## 2022-10-17 DIAGNOSIS — E539 Vitamin B deficiency, unspecified: Secondary | ICD-10-CM | POA: Insufficient documentation

## 2022-10-20 ENCOUNTER — Encounter: Payer: Self-pay | Admitting: Nurse Practitioner

## 2022-10-20 ENCOUNTER — Ambulatory Visit (INDEPENDENT_AMBULATORY_CARE_PROVIDER_SITE_OTHER): Payer: 59 | Admitting: Nurse Practitioner

## 2022-10-20 VITALS — BP 120/70 | HR 98 | Temp 98.5°F | Ht 61.0 in | Wt 167.4 lb

## 2022-10-20 DIAGNOSIS — E6609 Other obesity due to excess calories: Secondary | ICD-10-CM

## 2022-10-20 DIAGNOSIS — E559 Vitamin D deficiency, unspecified: Secondary | ICD-10-CM

## 2022-10-20 DIAGNOSIS — E7800 Pure hypercholesterolemia, unspecified: Secondary | ICD-10-CM

## 2022-10-20 DIAGNOSIS — Z0000 Encounter for general adult medical examination without abnormal findings: Secondary | ICD-10-CM

## 2022-10-20 DIAGNOSIS — Z23 Encounter for immunization: Secondary | ICD-10-CM | POA: Diagnosis not present

## 2022-10-20 DIAGNOSIS — E038 Other specified hypothyroidism: Secondary | ICD-10-CM | POA: Diagnosis not present

## 2022-10-20 DIAGNOSIS — E539 Vitamin B deficiency, unspecified: Secondary | ICD-10-CM

## 2022-10-20 DIAGNOSIS — E063 Autoimmune thyroiditis: Secondary | ICD-10-CM | POA: Diagnosis not present

## 2022-10-20 DIAGNOSIS — Z6832 Body mass index (BMI) 32.0-32.9, adult: Secondary | ICD-10-CM

## 2022-10-20 NOTE — Progress Notes (Signed)
Barnet Glasgow Martin,acting as a Education administrator for [name], FNP.,have documented all relevant documentation on the behalf of [name], FNP,as directed by  [name], FNP while in the presence of [name], [place].   Subjective:     Patient ID: Haley Perez , female    DOB: 12/26/1990 , 32 y.o.   MRN: [id number]   Chief Complaint  Patient presents with   Annual Exam    HPI  Here for HM.  Patient states compliance with meds and has no other concerns today.Patient is established with GYN.  BP Readings from Last 3 Encounters: 10/20/22 : 120/70 06/17/22 : 121/81 05/19/22 : 122/80  Wt Readings from Last 3 Encounters: 10/20/22 : 167 lb 6.4 oz (75.9 kg) 06/17/22 : 164 lb (74.4 kg) 04/10/22 : 167 lb (75.8 kg)          Past Medical History:  Diagnosis Date   Anxiety    Hypothyroid      Family History  Problem Relation Age of Onset   Thyroid disease Mother    Healthy Father      Current Outpatient Medications:    ALPRAZolam (XANAX) 0.25 MG tablet, Take 0.25 mg by mouth daily as needed., Disp: , Rfl:    B Complex-Folic Acid (B COMPLEX VITAMINS, W/ FA,) CAPS, Take 1 capsule by mouth daily., Disp: , Rfl:    fluticasone (FLONASE) 50 MCG/ACT nasal spray, Place 2 sprays into both nostrils daily., Disp: , Rfl:    levonorgestrel (MIRENA) 20 MCG/DAY IUD, 1 each by Intrauterine route once., Disp: , Rfl:    levothyroxine (SYNTHROID) 137 MCG tablet, Take 137 mcg by mouth daily before breakfast., Disp: , Rfl:    Vitamin D, Ergocalciferol, (DRISDOL) 1.25 MG (50000 UNIT) CAPS capsule, Take 50,000 Units by mouth every 7 (seven) days., Disp: , Rfl:    No Known Allergies    The patient states she uses IUD for birth control.  Patient's last menstrual period was 10/17/2022.  Negative for Dysmenorrhea and Negative for Menorrhagia. Negative for: breast discharge, breast lump(s), breast pain and breast self exam. Associated symptoms include abnormal vaginal bleeding. Pertinent negatives  include abnormal bleeding (hematology), anxiety, decreased libido, depression, difficulty falling sleep, dyspareunia, history of infertility, nocturia, sexual dysfunction, sleep disturbances, urinary incontinence, urinary urgency, vaginal discharge and vaginal itching. Diet regular. The patient states her exercise level is minimal - she will take her dog for walks about 1 mile.   The patient's tobacco use is:  Social History   Tobacco Use  Smoking Status Every Day   Packs/day: 0.50   Years: 5.00   Total pack years: 2.50   Types: Cigarettes  Smokeless Tobacco Never  Tobacco Comments   Smoking 6 cigarettes a day.   She has been exposed to passive smoke. The patient's alcohol use is:  Social History   Substance and Sexual Activity  Alcohol Use Yes   Comment: socially every couple weeks   Additional information: Last pap 01/11/2021, next one scheduled for 5/202/2025.    Review of Systems  Constitutional: Negative.   HENT: Negative.    Eyes: Negative.   Respiratory: Negative.    Cardiovascular: Negative.   Gastrointestinal: Negative.   Endocrine: Negative.   Genitourinary: Negative.   Musculoskeletal: Negative.   Skin: Negative.   Allergic/Immunologic: Negative.   Neurological: Negative.   Hematological: Negative.   Psychiatric/Behavioral: Negative.       Today's Vitals   10/20/22 0854  BP: 120/70  Pulse: 98  Temp: 98.5 F (36.9 C)  TempSrc: Oral  Weight: 167 lb 6.4 oz (75.9 kg)  Height: '5\' 1"'$  (1.549 m)  PainSc: 0-No pain   Body mass index is 31.63 kg/m.  Wt Readings from Last 3 Encounters:  10/20/22 167 lb 6.4 oz (75.9 kg)  06/17/22 164 lb (74.4 kg)  04/10/22 167 lb (75.8 kg)    Objective:  Physical Exam Vitals reviewed.  Constitutional:      General: She is not in acute distress.    Appearance: Normal appearance. She is well-developed. She is obese.  HENT:     Head: Normocephalic and atraumatic.     Right Ear: Hearing, tympanic membrane, ear canal and  external ear normal. There is no impacted cerumen.     Left Ear: Hearing, tympanic membrane, ear canal and external ear normal. There is no impacted cerumen.     Nose:     Comments: Deferred - masked    Mouth/Throat:     Comments: Deferred - masked Eyes:     General: Lids are normal.     Extraocular Movements: Extraocular movements intact.     Conjunctiva/sclera: Conjunctivae normal.     Pupils: Pupils are equal, round, and reactive to light.     Funduscopic exam:    Right eye: No papilledema.        Left eye: No papilledema.  Neck:     Thyroid: No thyroid mass.     Vascular: No carotid bruit.  Cardiovascular:     Rate and Rhythm: Normal rate and regular rhythm.     Pulses: Normal pulses.     Heart sounds: Normal heart sounds. No murmur heard. Pulmonary:     Effort: Pulmonary effort is normal. No respiratory distress.     Breath sounds: Normal breath sounds. No wheezing.  Chest:     Chest wall: No mass.  Breasts:    Tanner Score is 5.     Right: Normal. No mass or tenderness.     Left: Normal. No mass or tenderness.  Abdominal:     General: Abdomen is flat. Bowel sounds are normal. There is no distension.     Palpations: Abdomen is soft.     Tenderness: There is no abdominal tenderness.  Genitourinary:    Comments: Deferred - will refer to GYN  Musculoskeletal:        General: No swelling. Normal range of motion.     Cervical back: Full passive range of motion without pain, normal range of motion and neck supple.     Right lower leg: No edema.     Left lower leg: No edema.  Lymphadenopathy:     Upper Body:     Right upper body: No supraclavicular, axillary or pectoral adenopathy.     Left upper body: No supraclavicular, axillary or pectoral adenopathy.  Skin:    General: Skin is warm and dry.     Capillary Refill: Capillary refill takes less than 2 seconds.  Neurological:     General: No focal deficit present.     Mental Status: She is alert and oriented to person,  place, and time.     Cranial Nerves: No cranial nerve deficit.     Sensory: No sensory deficit.  Psychiatric:        Mood and Affect: Mood normal.        Behavior: Behavior normal.        Thought Content: Thought content normal.        Judgment: Judgment normal.  Assessment And Plan:     1. Annual physical exam Behavior modifications discussed and diet history reviewed.   Pt will continue to exercise regularly and modify diet with low GI, plant based foods and decrease intake of processed foods.  Recommend intake of daily multivitamin, Vitamin D, and calcium.  Recommend for preventive screenings to include self breast exams as well as recommend immunizations that include influenza (given today), TDAP - CBC - CMP14+EGFR - Lipid panel  2. Need for influenza vaccination Influenza vaccine administered Encouraged to take Tylenol as needed for fever or muscle aches. - Flu Vaccine QUAD 6+ mos PF IM (Fluarix Quad PF)  3. Class 1 obesity due to excess calories with serious comorbidity and body mass index (BMI) of 32.0 to 32.9 in adult She is encouraged to strive for BMI less than 30 to decrease cardiac risk. Advised to aim for at least 150 minutes of exercise per week.  4. Hypothyroidism due to Hashimoto's thyroiditis Comments: She is currently seeing Endocrinology and recently diagnosed with hashimoto's. Continue f/u with endocrinology  5. Vitamin D deficiency Comments: She is on vitamin d supplement 50,000 units weekly by her Endocrinologist - VITAMIN D 25 Hydroxy (Vit-D Deficiency, Fractures)  6. Vitamin B deficiency Comments: Will check levels, was normal at last check. Continue current supplement - Vitamin B12  7. Elevated LDL cholesterol level Comments: No current medications, will check lipid panel. Continue to limit intake of fried and fatty foods.   Patient was given opportunity to ask questions. Patient verbalized understanding of the plan and was able to repeat  key elements of the plan. All questions were answered to their satisfaction.   [name], FNP   I, [name], FNP, have reviewed all documentation for this visit. The documentation on 10/20/22 for the exam, diagnosis, procedures, and orders are all accurate and complete.   THE PATIENT IS ENCOURAGED TO PRACTICE SOCIAL DISTANCING DUE TO THE COVID-19 PANDEMIC.

## 2022-10-20 NOTE — Progress Notes (Deleted)
   Subjective:     Patient ID: Haley Perez , female    DOB: 1991/04/12 , 32 y.o.   MRN: [id number]   Chief Complaint  Patient presents with   Annual Exam    HPI  HPI   Past Medical History:  Diagnosis Date   Anxiety    Hypothyroid      Family History  Problem Relation Age of Onset   Thyroid disease Mother    Healthy Father      Current Outpatient Medications:    ALPRAZolam (XANAX) 0.25 MG tablet, Take 0.25 mg by mouth daily as needed., Disp: , Rfl:    levonorgestrel (MIRENA) 20 MCG/DAY IUD, 1 each by Intrauterine route once., Disp: , Rfl:    levothyroxine (SYNTHROID) 100 MCG tablet, Take 1 tablet by mouth daily, Disp: 90 tablet, Rfl: 1   No Known Allergies    The patient states she uses {contraceptive methods:5051} for birth control. Last LMP was No LMP recorded. (Menstrual status: IUD).. {Dysmenorrhea-menorrhagia:21918}. Negative for: breast discharge, breast lump(s), breast pain and breast self exam. Associated symptoms include abnormal vaginal bleeding. Pertinent negatives include abnormal bleeding (hematology), anxiety, decreased libido, depression, difficulty falling sleep, dyspareunia, history of infertility, nocturia, sexual dysfunction, sleep disturbances, urinary incontinence, urinary urgency, vaginal discharge and vaginal itching. Diet regular.The patient states her exercise level is    . The patient's tobacco use is:  Social History   Tobacco Use  Smoking Status Every Day   Packs/day: 0.50   Years: 5.00   Total pack years: 2.50   Types: Cigarettes  Smokeless Tobacco Never  Tobacco Comments   Smoking 6 cigarettes a day.  . She has been exposed to passive smoke. The patient's alcohol use is:  Social History   Substance and Sexual Activity  Alcohol Use Yes   Comment: socially every couple weeks  . Additional information: Last pap ***, next one scheduled for ***.    Review of Systems   There were no vitals filed for this visit. There is no height or  weight on file to calculate BMI.   Objective:  Physical Exam      Assessment And Plan:     1. Annual physical exam  2. Acquired hypothyroidism  3. Vitamin D deficiency  4. Class 1 obesity due to excess calories with serious comorbidity and body mass index (BMI) of 32.0 to 32.9 in adult     Patient was given opportunity to ask questions. Patient verbalized understanding of the plan and was able to repeat key elements of the plan. All questions were answered to their satisfaction.   Haley Perez, [name] Haley Perez, CMA, have reviewed all documentation for this visit. The documentation on 10/20/22 for the exam, diagnosis, procedures, and orders are all accurate and complete.   THE PATIENT IS ENCOURAGED TO PRACTICE SOCIAL DISTANCING DUE TO THE COVID-19 PANDEMIC.

## 2022-10-21 LAB — CBC
Hematocrit: 43.7 % (ref 34.0–46.6)
Hemoglobin: 14.9 g/dL (ref 11.1–15.9)
MCH: 32.3 pg (ref 26.6–33.0)
MCHC: 34.1 g/dL (ref 31.5–35.7)
MCV: 95 fL (ref 79–97)
Platelets: 281 10*3/uL (ref 150–450)
RBC: 4.62 x10E6/uL (ref 3.77–5.28)
RDW: 12.2 % (ref 11.7–15.4)
WBC: 9.3 10*3/uL (ref 3.4–10.8)

## 2022-10-21 LAB — LIPID PANEL
Chol/HDL Ratio: 3.4 ratio (ref 0.0–4.4)
Cholesterol, Total: 175 mg/dL (ref 100–199)
HDL: 51 mg/dL (ref >39)
LDL Chol Calc (NIH): 109 mg/dL — ABNORMAL HIGH (ref 0–99)
Triglycerides: 79 mg/dL (ref 0–149)
VLDL Cholesterol Cal: 15 mg/dL (ref 5–40)

## 2022-10-21 LAB — CMP14+EGFR
ALT: 38 IU/L — ABNORMAL HIGH (ref 0–32)
AST: 19 IU/L (ref 0–40)
Albumin/Globulin Ratio: 1.8 (ref 1.2–2.2)
Albumin: 4.7 g/dL (ref 4.0–5.0)
Alkaline Phosphatase: 89 IU/L (ref 44–121)
BUN/Creatinine Ratio: 15 (ref 9–23)
BUN: 12 mg/dL (ref 6–20)
Bilirubin Total: 0.6 mg/dL (ref 0.0–1.2)
CO2: 19 mmol/L — ABNORMAL LOW (ref 20–29)
Calcium: 9.7 mg/dL (ref 8.7–10.2)
Chloride: 107 mmol/L — ABNORMAL HIGH (ref 96–106)
Creatinine, Ser: 0.78 mg/dL (ref 0.57–1.00)
Globulin, Total: 2.6 g/dL (ref 1.5–4.5)
Glucose: 88 mg/dL (ref 70–99)
Potassium: 4.2 mmol/L (ref 3.5–5.2)
Sodium: 144 mmol/L (ref 134–144)
Total Protein: 7.3 g/dL (ref 6.0–8.5)
eGFR: 105 mL/min/{1.73_m2} (ref >59)

## 2022-10-21 LAB — VITAMIN B12: Vitamin B-12: 649 pg/mL (ref 232–1245)

## 2022-10-21 LAB — VITAMIN D 25 HYDROXY (VIT D DEFICIENCY, FRACTURES): Vit D, 25-Hydroxy: 43.9 ng/mL (ref 30.0–100.0)

## 2022-11-04 ENCOUNTER — Ambulatory Visit (INDEPENDENT_AMBULATORY_CARE_PROVIDER_SITE_OTHER): Payer: 59

## 2022-11-04 VITALS — BP 120/70 | HR 89 | Temp 98.7°F | Ht 61.0 in | Wt 167.0 lb

## 2022-11-04 DIAGNOSIS — Z23 Encounter for immunization: Secondary | ICD-10-CM | POA: Diagnosis not present

## 2022-11-04 NOTE — Progress Notes (Signed)
Patient presents today for a covid vaccine. Patient waited 15 mins after shot for reaction. Patient expressed feeling great after shot.

## 2022-11-04 NOTE — Patient Instructions (Signed)
SARS-CoV-2 Virus (COVID-19) Vaccine Injection (Janssen) What is this medication? COVID-19 VACCINE (koh-vid 19 vak SEEN) reduces the risk of COVID-19. It does not treat COVID-19. It is still possible to get COVID-19 after receiving this vaccine, but the symptoms may be less severe or not last as long. It works by helping your immune system learn how to fight off a future infection. This vaccine is no longer approved for use by the FDA. This medicine may be used for other purposes; ask your health care provider or pharmacist if you have questions. COMMON BRAND NAME(S): Janssen COVID-19 Vaccine What should I tell my care team before I take this medication? They need to know if you have any of these conditions: Any allergies Bleeding disorder Blood clots Immune system problems Recent or upcoming vaccine including previous COVID-19 vaccine An unusual or allergic reaction to COVID-19 vaccine, other medications, foods, dyes, or preservatives Pregnant or trying to get pregnant Breast-feeding How should I use this medication? This vaccine is injected into a muscle. It is given by your care team. Booster doses may be needed after finishing the primary series. Talk to your care team about your vaccination schedule. A copy of the Fact Sheet for Recipients and Caregivers will be given before each vaccination. Be sure to read this information carefully each time. This sheet may change often. Talk to your care team about the use of this vaccine in children. Special care may be needed. Overdosage: If you think you have taken too much of this medicine contact a poison control center or emergency room at once. NOTE: This medicine is only for you. Do not share this medicine with others. What if I miss a dose? This does not apply. This medication is not for regular use. What may interact with this medication? This medication may interact with the following: Certain medications that thin your  blood Chemotherapy or radiation therapy Medications that lower your chance of fighting infection Steroid medications, such as prednisone or cortisone This list may not describe all possible interactions. Give your health care provider a list of all the medicines, herbs, non-prescription drugs, or dietary supplements you use. Also tell them if you smoke, drink alcohol, or use illegal drugs. Some items may interact with your medicine. What should I watch for while using this medication? Visit your care team regularly. This vaccine can cause a blood clotting problem called thrombosis with thrombocytopenia syndrome (TTS). TTS is a rare but serious medical condition that can be deadly. Get medical help right away if you have a severe headache, change in vision, stomach pain, nausea, vomiting, back pain, chest pain, trouble breathing, pain, swelling, or warmth in leg, or unusual bruising or bleeding. This vaccine, like all vaccines, may not fully protect everyone. Continue to follow all guidelines to prevent exposure. What side effects may I notice from receiving this medication? Side effects that you should report to your care team as soon as possible: Allergic reactions--skin rash, itching, hives, swelling of the face, lips, tongue, or throat Blood clot--pain, swelling, or warmth in the leg, shortness of breath, chest pain Change in vision Heart muscle inflammation--unusual weakness or fatigue, shortness of breath, chest pain, fast or irregular heartbeat, dizziness, swelling of the ankles, feet, or hands Severe headache Severe stomach pain Trouble breathing Usual bruising or bleeding Side effects that usually do not require medical attention (report these to your care team if they continue or are bothersome): Chills Fatigue Fever Headache Joint pain Muscle pain Nausea Pain, redness, or irritation   at injection site Swollen lymph nodes Vomiting This list may not describe all possible side  effects. Call your doctor for medical advice about side effects. You may report side effects to FDA at 1-800-FDA-1088. Where should I keep my medication? This vaccine is only given by your care team. It will not be stored at home. NOTE: This sheet is a summary. It may not cover all possible information. If you have questions about this medicine, talk to your doctor, pharmacist, or health care provider.  2023 Elsevier/Gold Standard (2021-04-04 00:00:00)  

## 2023-02-17 IMAGING — US US PELVIS COMPLETE WITH TRANSVAGINAL
2 series · 13 of 25 positions shown · non-contrast
Comparison: None

CLINICAL DATA: Initial evaluation for right-sided pelvic pain for 3
weeks.



[Series 1: us pelvis complete with transvaginal · 0.30mm/px · 12 of 44 slices shown (1 of 2)]
[im 1/44]
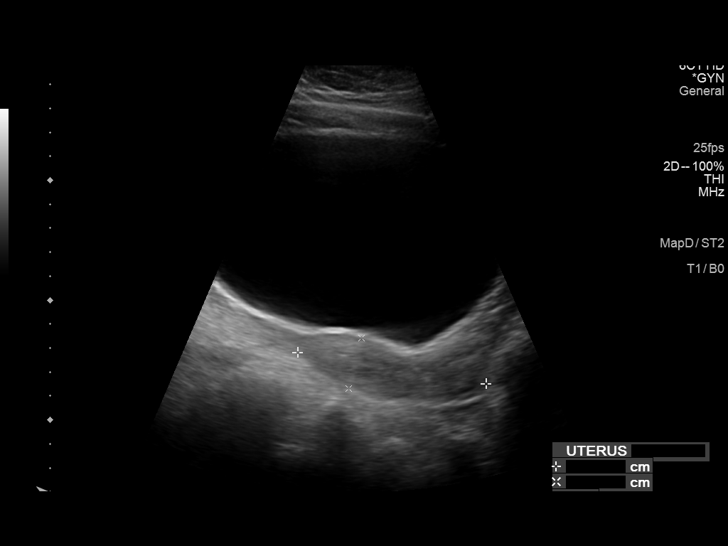
[im 4/44]
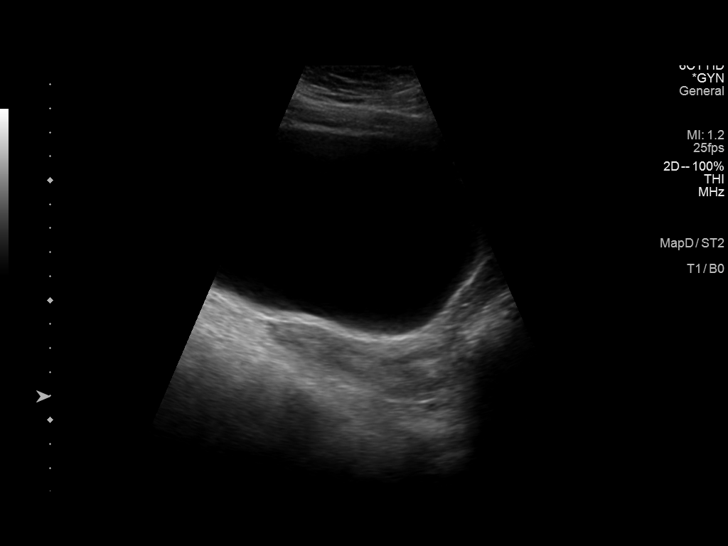
[im 8/44]
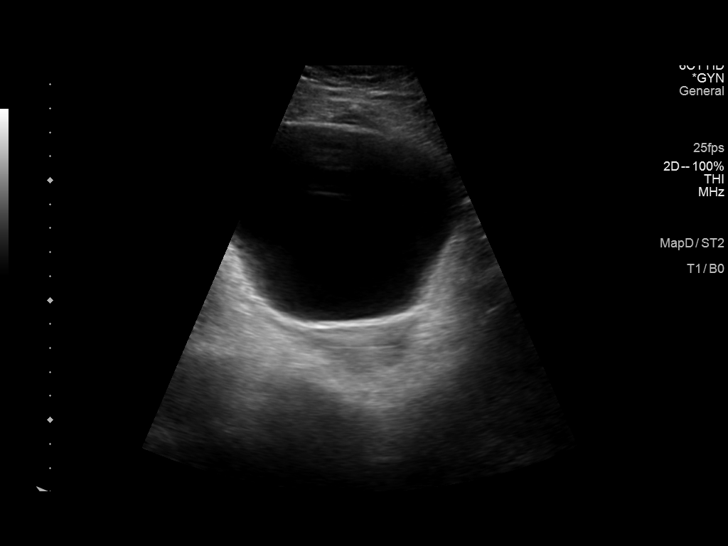
[im 12/44]
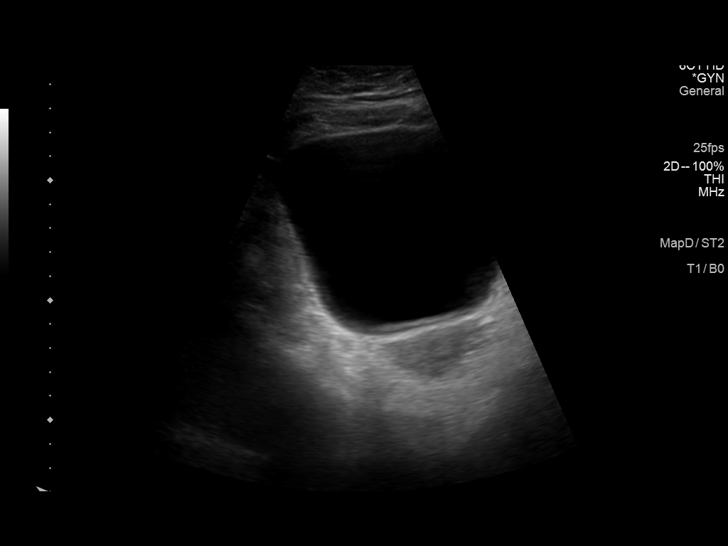
[im 15/44]
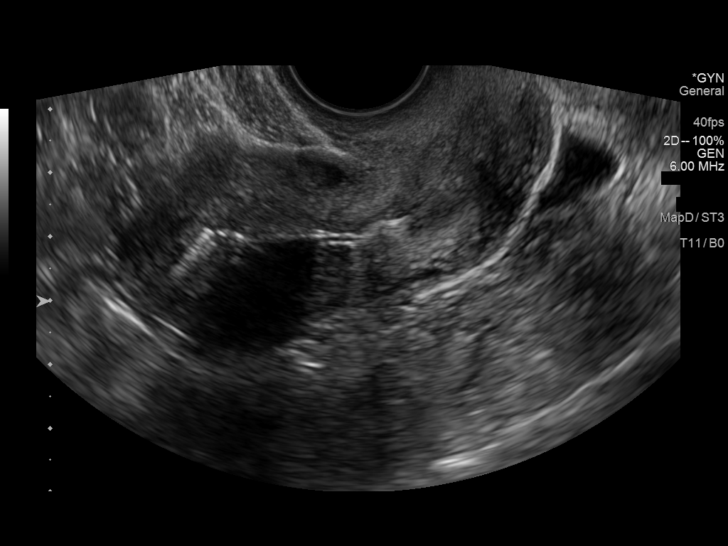
[im 19/44]
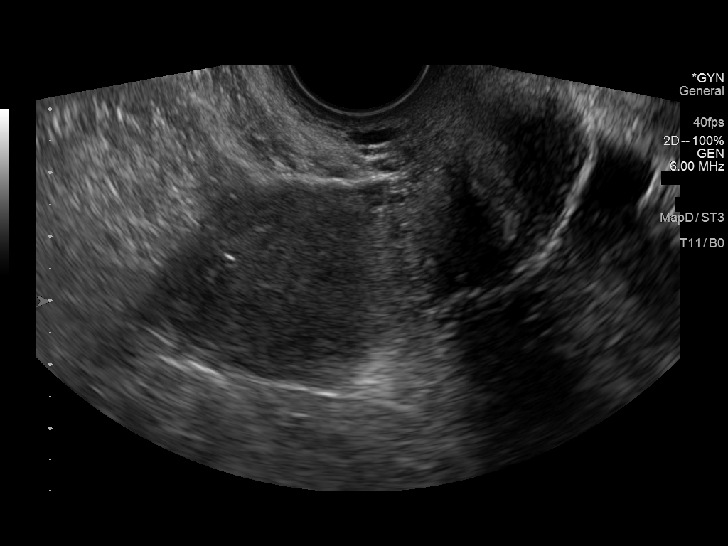
[im 23/44]
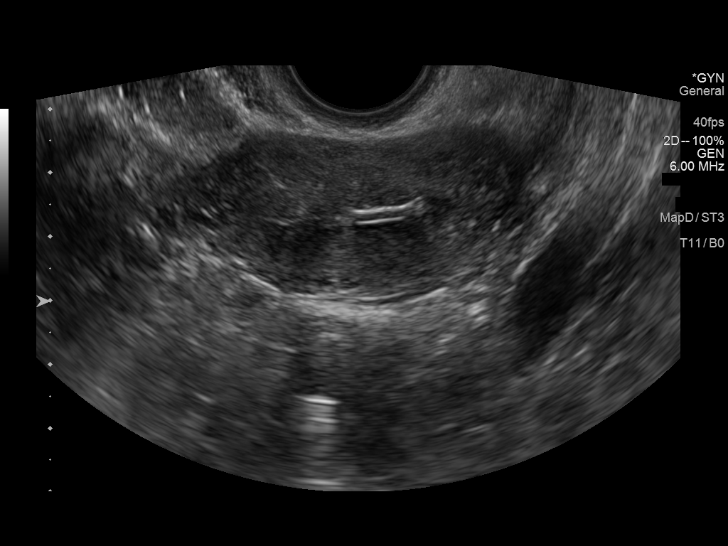
[im 27/44]
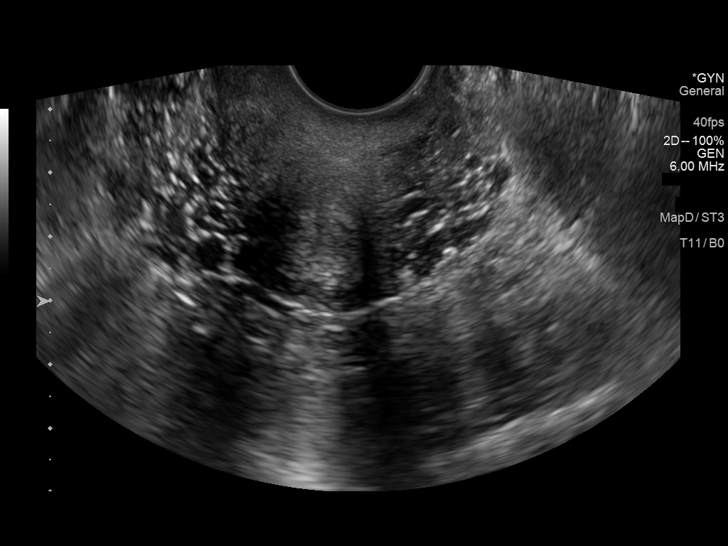
[im 30/44]
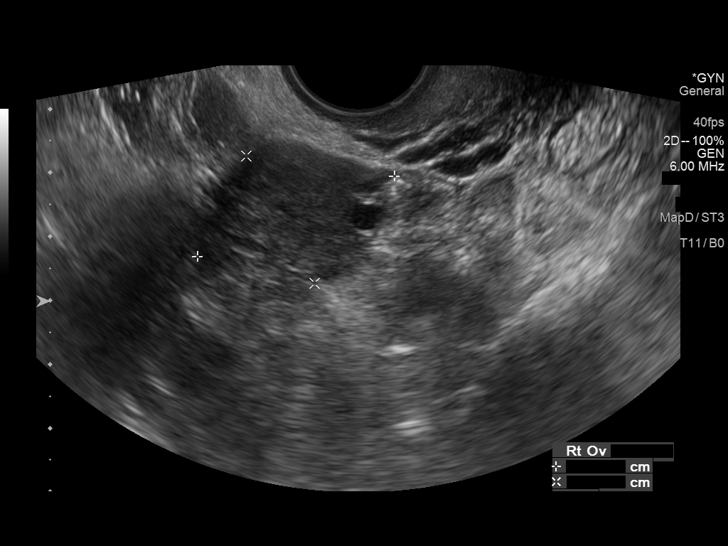
[im 34/44]
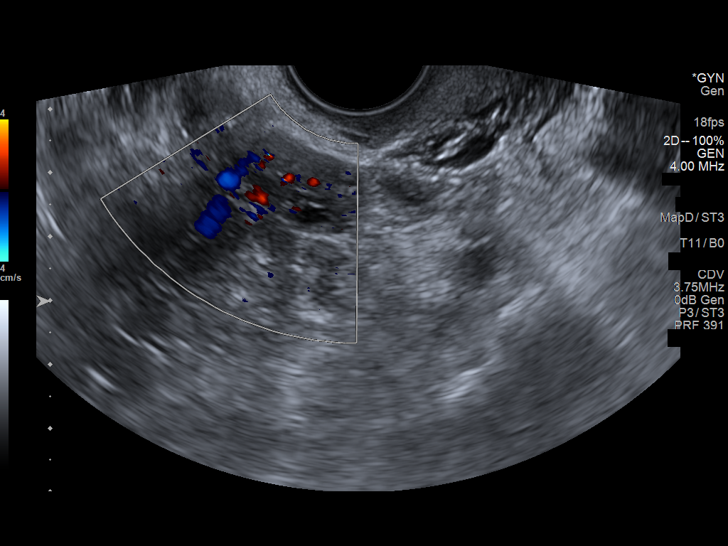
[im 38/44]
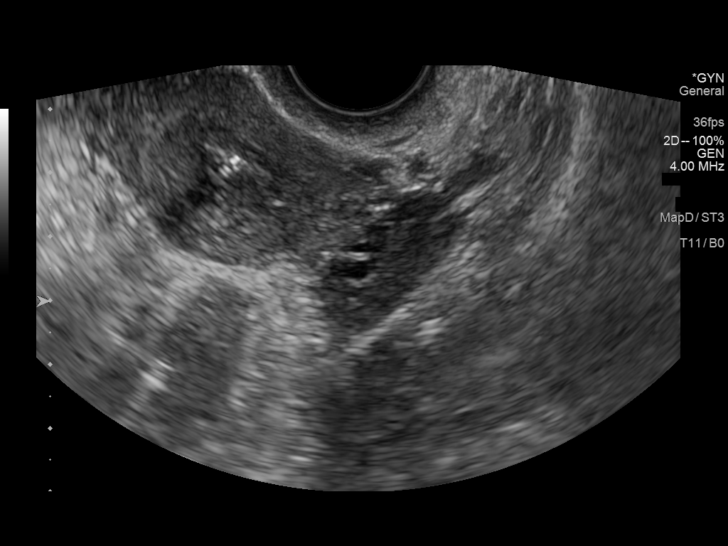
[im 42/44]
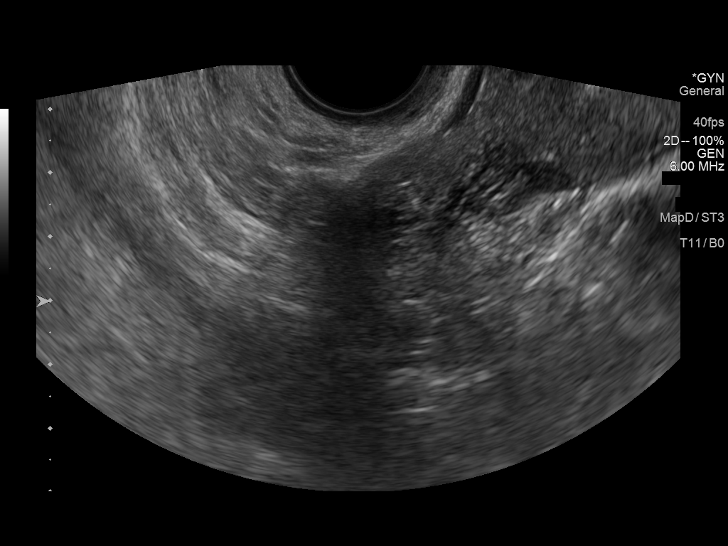

[Series 1001: us pelvis complete with transvaginal · 1 of 2 slices shown (2 of 2)]
[im 1/2]
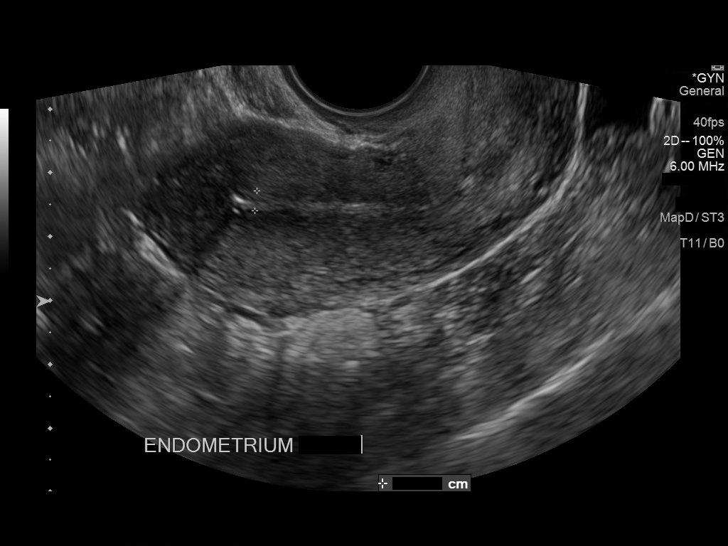

[13 of 25 positions shown; findings below may reference images not displayed]

FINDINGS: Uterus

Measurements: 8.0 x 2.2 x 3.8 cm = volume: 34.7 mL. Uterus is
anteverted. No discrete fibroid or other mass.

Endometrium

Thickness: 3.1 mm. No focal abnormality visualized. IUD in
appropriate position within the endometrial cavity at the level of
the uterine body/fundus.

Right ovary

Measurements: 3.3 x 2.3 x 2.5 cm = volume: 9.7 mL. Normal
appearance/no adnexal mass.

Left ovary

Measurements: 2.9 x 1.2 x 1.6 cm = volume: 2.8 mL. Normal
appearance/no adnexal mass.

Other findings

Trace free fluid within the pelvis, presumably physiologic.
IMPRESSION: 1. Normal pelvic ultrasound. No findings to explain patient's
symptoms identified.
2. IUD in appropriate position within the endometrial cavity.

## 2023-04-20 ENCOUNTER — Encounter: Payer: Self-pay | Admitting: Nurse Practitioner

## 2023-04-20 ENCOUNTER — Ambulatory Visit: Payer: 59 | Admitting: Nurse Practitioner

## 2023-04-20 VITALS — BP 110/70 | HR 88 | Temp 98.9°F | Ht 62.6 in | Wt 160.8 lb

## 2023-04-20 DIAGNOSIS — E038 Other specified hypothyroidism: Secondary | ICD-10-CM | POA: Diagnosis not present

## 2023-04-20 DIAGNOSIS — E063 Autoimmune thyroiditis: Secondary | ICD-10-CM | POA: Diagnosis not present

## 2023-04-20 DIAGNOSIS — Z720 Tobacco use: Secondary | ICD-10-CM | POA: Diagnosis not present

## 2023-04-20 NOTE — Progress Notes (Signed)
Madelaine Bhat, CMA,acting as a Neurosurgeon for Arnette Felts, FNP.,have documented all relevant documentation on the behalf of Arnette Felts, FNP,as directed by  Arnette Felts, FNP while in the presence of Arnette Felts, FNP.  Subjective:  Patient ID: Haley Perez , female    DOB: 02-26-1991 , 32 y.o.   MRN: [id number]  Chief Complaint  Patient presents with   Hypothyroidism    HPI  Patient presents today for a tsh follow up, Patient reports compliance with medications. Patient denies any chest pain, SOB, or headache. Patient has no concerns today.  She is seeing Endocrinology for her thyroid. She had a thyroid ultrasound which shows blood flow has improved but still has a nodule.   She went scuba diving yesterday and was having difficulty with "equalizing" she is not having pain today. Has been using flonase and generic antihistamine. She cut her day short yesterday.      Past Medical History:  Diagnosis Date   Anxiety    BMI 31.0-31.9,adult 01/29/2019   Hypothyroid      Family History  Problem Relation Age of Onset   Thyroid disease Mother    Healthy Father      Current Outpatient Medications:    B Complex-Folic Acid (B COMPLEX VITAMINS, W/ FA,) CAPS, Take 1 capsule by mouth daily., Disp: , Rfl:    fluticasone (FLONASE) 50 MCG/ACT nasal spray, Place 2 sprays into both nostrils daily., Disp: , Rfl:    levonorgestrel (MIRENA) 20 MCG/DAY IUD, 1 each by Intrauterine route once., Disp: , Rfl:    levothyroxine (SYNTHROID) 137 MCG tablet, Take 137 mcg by mouth daily before breakfast., Disp: , Rfl:    Vitamin D, Ergocalciferol, (DRISDOL) 1.25 MG (50000 UNIT) CAPS capsule, Take 50,000 Units by mouth every 7 (seven) days., Disp: , Rfl:    ALPRAZolam (XANAX) 0.25 MG tablet, Take 0.25 mg by mouth daily as needed. (Patient not taking: Reported on 04/20/2023), Disp: , Rfl:    No Known Allergies   Review of Systems  Constitutional: Negative.   HENT:  Positive for ear pain (right ear during her  scuba diving).   Eyes: Negative.   Respiratory: Negative.    Cardiovascular: Negative.   Gastrointestinal: Negative.   Neurological: Negative.   Psychiatric/Behavioral: Negative.       Today's Vitals   04/20/23 0835  BP: 110/70  Pulse: 88  Temp: 98.9 F (37.2 C)  Weight: 160 lb 12.8 oz (72.9 kg)  Height: 5' 2.6" (1.59 m)  PainSc: 0-No pain   Body mass index is 28.85 kg/m.  Wt Readings from Last 3 Encounters:  04/20/23 160 lb 12.8 oz (72.9 kg)  11/04/22 167 lb (75.8 kg)  10/20/22 167 lb 6.4 oz (75.9 kg)    The ASCVD Risk score (Arnett DK, et al., 2019) failed to calculate for the following reasons:   The 2019 ASCVD risk score is only valid for ages 31 to 68  Objective:  Physical Exam Vitals reviewed.  Constitutional:      General: She is not in acute distress.    Appearance: Normal appearance.  HENT:     Right Ear: Tympanic membrane, ear canal and external ear normal. There is no impacted cerumen.     Left Ear: Tympanic membrane, ear canal and external ear normal. There is no impacted cerumen.  Cardiovascular:     Rate and Rhythm: Normal rate and regular rhythm.     Pulses: Normal pulses.     Heart sounds: Normal heart sounds. No murmur  heard. Pulmonary:     Effort: Pulmonary effort is normal. No respiratory distress.     Breath sounds: Normal breath sounds. No wheezing.  Skin:    General: Skin is warm and dry.     Capillary Refill: Capillary refill takes less than 2 seconds.  Neurological:     General: No focal deficit present.     Mental Status: She is alert and oriented to person, place, and time.     Cranial Nerves: No cranial nerve deficit.     Motor: No weakness.  Psychiatric:        Mood and Affect: Mood normal.        Behavior: Behavior normal.        Thought Content: Thought content normal.        Judgment: Judgment normal.         Assessment And Plan:  Hypothyroidism due to Hashimoto's thyroiditis Assessment & Plan: She is doing better, labs  and ultrasound improved. Continue follow up with Endocrinology   Tobacco use Assessment & Plan: Discussed tobacco cessation and given number for [place] smoking cessation program.     Return for keep next appt as scheduled .   Patient was given opportunity to ask questions. Patient verbalized understanding of the plan and was able to repeat key elements of the plan. All questions were answered to their satisfaction.    Jeanell Sparrow, FNP, have reviewed all documentation for this visit. The documentation on 04/20/23 for the exam, diagnosis, procedures, and orders are all accurate and complete.   IF YOU HAVE BEEN REFERRED TO A SPECIALIST, IT MAY TAKE 1-2 WEEKS TO SCHEDULE/PROCESS THE REFERRAL. IF YOU HAVE NOT HEARD FROM US/SPECIALIST IN TWO WEEKS, PLEASE GIVE [place] A CALL AT [phone].

## 2023-04-20 NOTE — Assessment & Plan Note (Signed)
She is doing better, labs and ultrasound improved. Continue follow up with Endocrinology

## 2023-04-20 NOTE — Assessment & Plan Note (Signed)
Discussed tobacco cessation and given number for [place] smoking cessation program.

## 2023-04-20 NOTE — Patient Instructions (Signed)
Tobacco Cessation phone number with [facility] - [phone]

## 2023-05-22 ENCOUNTER — Ambulatory Visit (INDEPENDENT_AMBULATORY_CARE_PROVIDER_SITE_OTHER): Payer: 59 | Admitting: Obstetrics and Gynecology

## 2023-05-22 ENCOUNTER — Encounter: Payer: Self-pay | Admitting: Obstetrics and Gynecology

## 2023-05-22 VITALS — BP 109/72 | HR 97 | Ht 62.6 in | Wt 160.0 lb

## 2023-05-22 DIAGNOSIS — E039 Hypothyroidism, unspecified: Secondary | ICD-10-CM | POA: Diagnosis not present

## 2023-05-22 DIAGNOSIS — Z01419 Encounter for gynecological examination (general) (routine) without abnormal findings: Secondary | ICD-10-CM | POA: Diagnosis not present

## 2023-05-22 NOTE — Progress Notes (Signed)
Last pap- 01/11/21- neg

## 2023-05-22 NOTE — Progress Notes (Signed)
GYNECOLOGY ANNUAL PREVENTATIVE CARE ENCOUNTER NOTE  History:     Meyli Boice is a 32 y.o. G0P0000 female here for a routine annual gynecologic exam.  Current complaints: None.   Denies abnormal vaginal bleeding, discharge, pelvic pain, problems with intercourse or other gynecologic concerns.   Thinking about having children in the near future. Will call us for preconception and removal of IUD.    Gynecologic History No LMP recorded. (Menstrual status: IUD). Contraception: IUD Last Pap: 2022. Result was normal with negative HPV Last Mammogram: NA.     Obstetric History OB History  Gravida Para Term Preterm AB Living  0 0 0 0 0 0  SAB IAB Ectopic Multiple Live Births  0 0 0 0 0    Past Medical History:  Diagnosis Date   Anxiety    BMI 31.0-31.9,adult 01/29/2019   Hypothyroid     Past Surgical History:  Procedure Laterality Date   WISDOM TOOTH EXTRACTION      Current Outpatient Medications on File Prior to Visit  Medication Sig Dispense Refill   ALPRAZolam (XANAX) 0.25 MG tablet Take 0.25 mg by mouth daily as needed. (Patient not taking: Reported on 04/20/2023)     B Complex-Folic Acid (B COMPLEX VITAMINS, W/ FA,) CAPS Take 1 capsule by mouth daily. (Patient not taking: Reported on 05/22/2023)     fluticasone (FLONASE) 50 MCG/ACT nasal spray Place 2 sprays into both nostrils daily. (Patient not taking: Reported on 05/22/2023)     levonorgestrel (MIRENA) 20 MCG/DAY IUD 1 each by Intrauterine route once. (Patient not taking: Reported on 05/22/2023)     levothyroxine (SYNTHROID) 137 MCG tablet Take 137 mcg by mouth daily before breakfast. (Patient not taking: Reported on 05/22/2023)     Vitamin D, Ergocalciferol, (DRISDOL) 1.25 MG (50000 UNIT) CAPS capsule Take 50,000 Units by mouth every 7 (seven) days. (Patient not taking: Reported on 05/22/2023)     No current facility-administered medications on file prior to visit.    No Known Allergies  Social History:  reports that  she has been smoking cigarettes. She has a 2.5 pack-year smoking history. She has never used smokeless tobacco. She reports current alcohol use. She reports that she does not use drugs.  Family History  Problem Relation Age of Onset   Thyroid disease Mother    Healthy Father     The following portions of the patient's history were reviewed and updated as appropriate: allergies, current medications, past family history, past medical history, past social history, past surgical history and problem list.  Review of Systems Pertinent items noted in HPI and remainder of comprehensive ROS otherwise negative.  Physical Exam:  BP 109/72   Pulse 97   Ht 5' 2.6" (1.59 m)   Wt 160 lb (72.6 kg)   BMI 28.71 kg/m  CONSTITUTIONAL: Well-developed, well-nourished female in no acute distress.  HENT:  Normocephalic, atraumatic, External right and left ear normal.  EYES: Conjunctivae and EOM are normal. Pupils are equal, round, and reactive to light. No scleral icterus.  NECK: Normal range of motion, supple, no masses.  Normal thyroid.  SKIN: Skin is warm and dry. No rash noted. Not diaphoretic. No erythema. No pallor. MUSCULOSKELETAL: Normal range of motion. No tenderness.  No cyanosis, clubbing, or edema. NEUROLOGIC: Alert and oriented to person, place, and time. Normal reflexes, muscle tone coordination.  PSYCHIATRIC: Normal mood and affect. Normal behavior. Normal judgment and thought content. CARDIOVASCULAR: Normal heart rate noted, regular rhythm RESPIRATORY: Clear to auscultation bilaterally. Effort  and breath sounds normal, no problems with respiration noted. BREASTS: Symmetric in size. No masses, tenderness, skin changes, nipple drainage, or lymphadenopathy bilaterally. Performed in the presence of a chaperone. ABDOMEN: Soft, no distention noted.  No tenderness, rebound or guarding.  PELVIC: Deferred    Assessment and Plan:   1. Hypothyroidism (acquired)  TSH well controlled Has  endocrinologist Will need preconception visit prior to pregnancy.   2. Women's annual routine gynecological examination  Pap not due.    Normal breast examination today, she was advised to perform periodic self breast  Routine preventative health maintenance measures emphasized. Please refer to After Visit Summary for other counseling recommendations.   Marin Milley, Harolyn Rutherford, NP [facility] [employer], [facility]

## 2023-08-04 IMAGING — US US THYROID
1 series · 13 of 25 positions shown · non-contrast
Comparison: None Available.

CLINICAL DATA: Hypothyroid.

EXAM:
THYROID ULTRASOUND
TECHNIQUE: Ultrasound examination of the thyroid gland and adjacent soft
tissues was performed.

[Series 1: us thyroid · 0.06mm/px · 56 acquisitions, 13 frames shown]
[im 1/56]
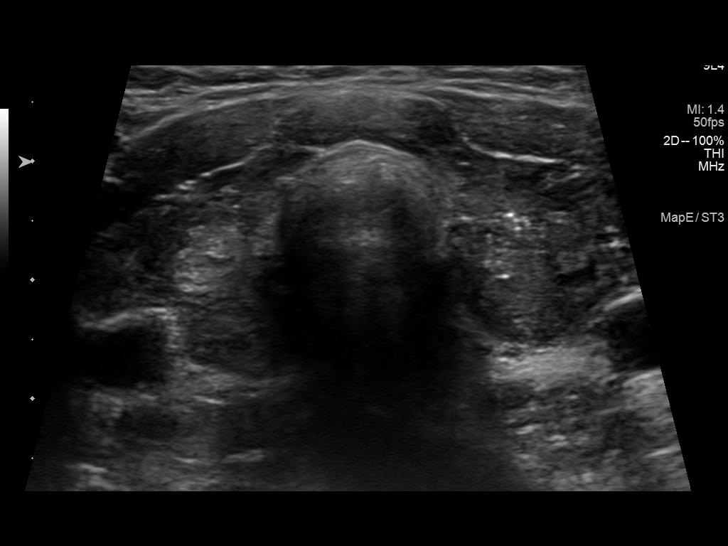
[im 5/56]
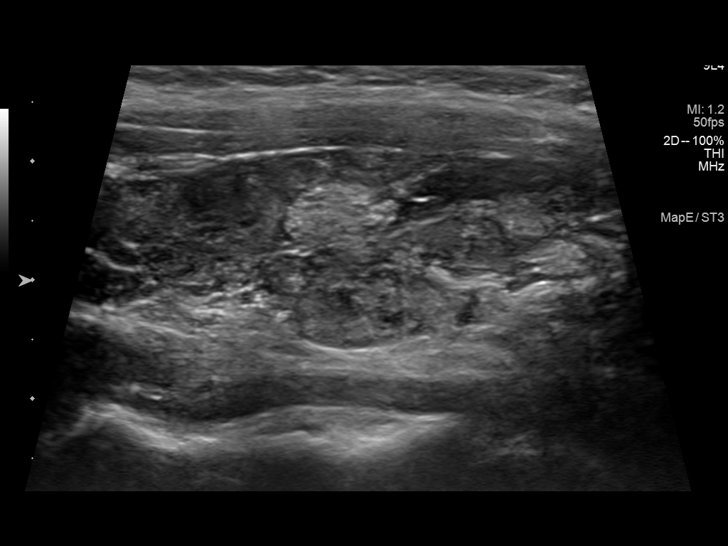
[im 10/56]
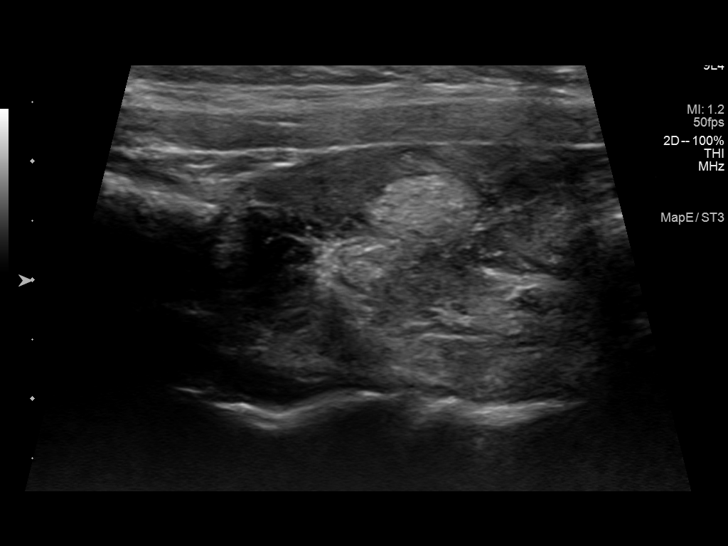
[im 14/56]
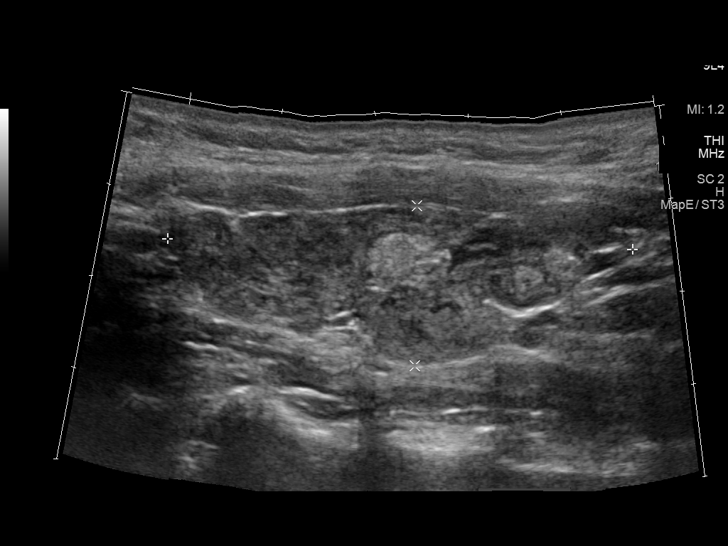
[im 19/56]
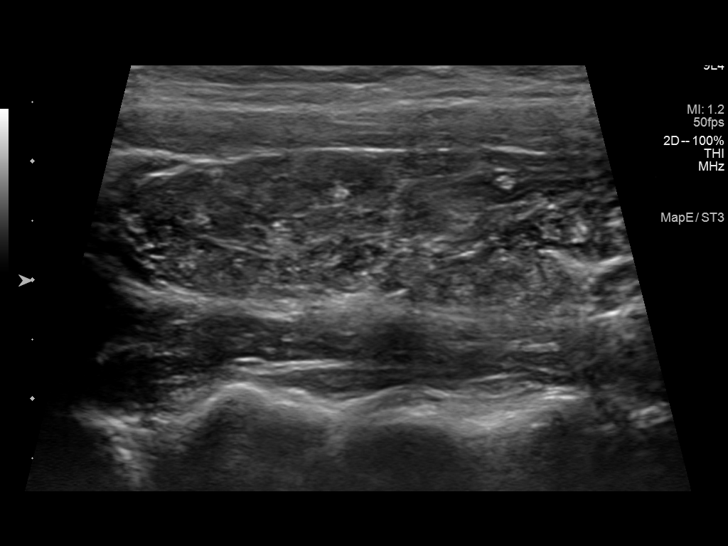
[im 23/56]
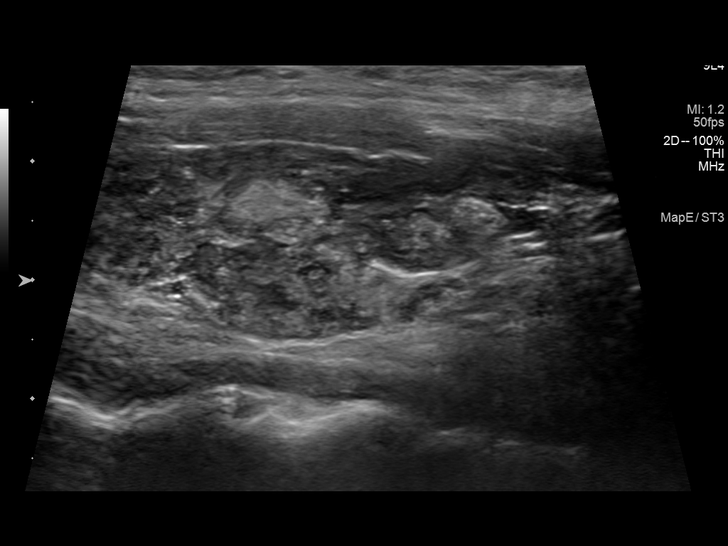
[im 28/56]
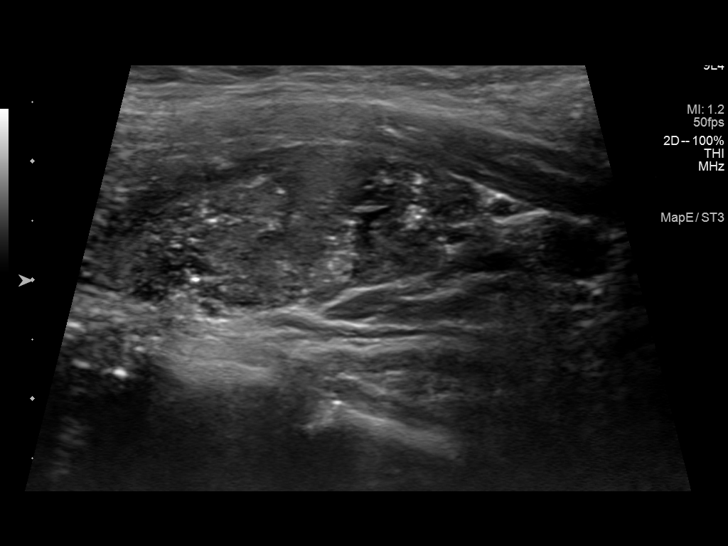
[im 33/56]
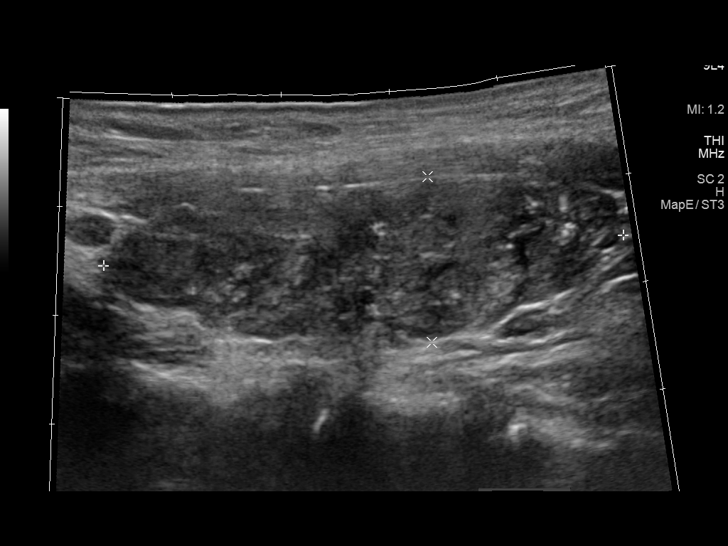
[im 37/56]
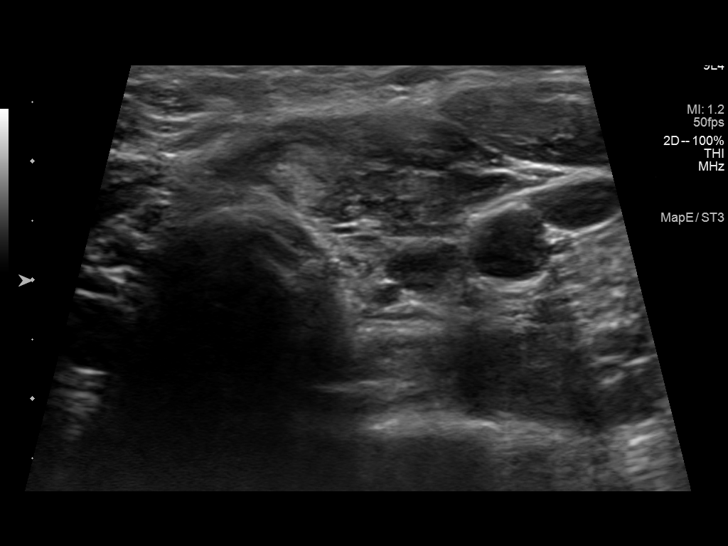
[im 42/56]
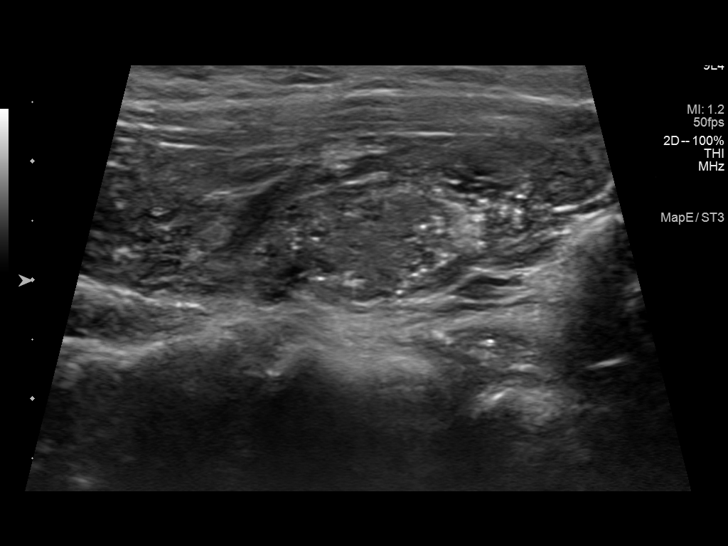
[im 46/56]
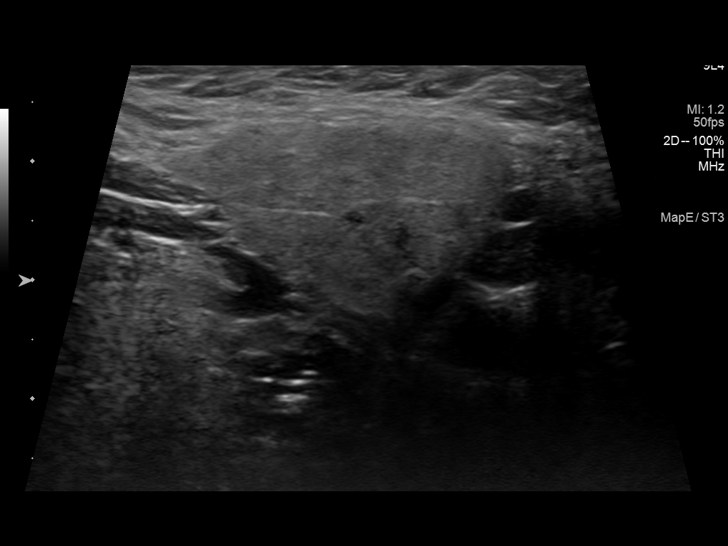
[im 51/56]
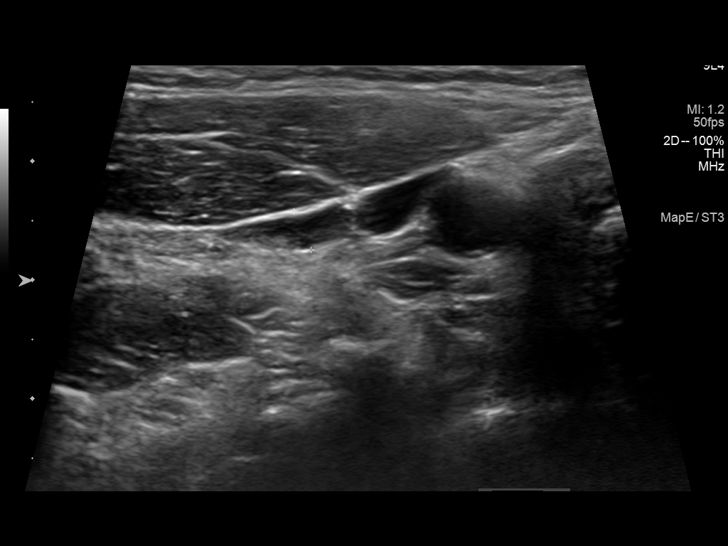
[im 56/56]
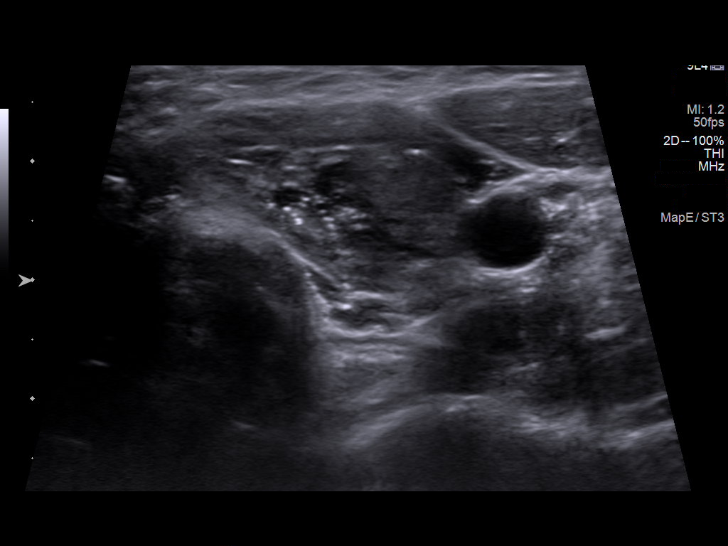

[13 of 25 positions shown; findings below may reference images not displayed]

FINDINGS: Parenchymal Echotexture: Markedly heterogeneous, diffusely increased
parenchymal vascularity

Isthmus: 0.4 cm

Right lobe: 5.0 x 1.7 x 2.0 cm

Left lobe: 4.8 x 1.5 x 1.7 cm

________________________________________________________

Estimated total number of nodules >/= 1 cm: 1

Number of spongiform nodules >/=  2 cm not described below (TR1): 0

Number of mixed cystic and solid nodules >/= 1.5 cm not described
below (TR2): 0

_________________________________________________________

Nodule # 1:

Location: Right; Mid

Maximum size: 1.0 cm; Other 2 dimensions: 0.9 x 0.6 cm

Composition: solid/almost completely solid (2)

Echogenicity: isoechoic (1)

Shape: not taller-than-wide (0)

Margins: smooth (0)

Echogenic foci: none (0)

ACR TI-RADS total points: 3.

ACR TI-RADS risk category: TR3 (3 points).

ACR TI-RADS recommendations:

Given size (<1.4 cm) and appearance, this nodule does NOT meet
TI-RADS criteria for biopsy or dedicated follow-up.

_________________________________________________________

No cervical lymphadenopathy.
IMPRESSION: 1. Marked heterogeneity and hypervascularity of the otherwise normal
sized thyroid gland. These findings are most compatible with
thyroiditis or autoimmune thyroid disorder.
2. Hyperechoic, benign-appearing nodule in the right mid thyroid
(labeled 1, 1.0 cm, TI-RADS category 3), not requiring additional
ultrasound follow-up or tissue sampling.

The above is in keeping with the ACR TI-RADS recommendations - [HOSPITAL] 1651;[DATE].

## 2023-10-22 ENCOUNTER — Ambulatory Visit: Payer: 59 | Admitting: Nurse Practitioner

## 2023-10-22 ENCOUNTER — Encounter: Payer: Self-pay | Admitting: Nurse Practitioner

## 2023-10-22 VITALS — BP 120/70 | HR 90 | Temp 98.6°F | Ht 62.0 in | Wt 156.8 lb

## 2023-10-22 DIAGNOSIS — Z79899 Other long term (current) drug therapy: Secondary | ICD-10-CM

## 2023-10-22 DIAGNOSIS — Z0000 Encounter for general adult medical examination without abnormal findings: Secondary | ICD-10-CM | POA: Insufficient documentation

## 2023-10-22 DIAGNOSIS — E063 Autoimmune thyroiditis: Secondary | ICD-10-CM | POA: Diagnosis not present

## 2023-10-22 DIAGNOSIS — E7800 Pure hypercholesterolemia, unspecified: Secondary | ICD-10-CM | POA: Diagnosis not present

## 2023-10-22 DIAGNOSIS — R21 Rash and other nonspecific skin eruption: Secondary | ICD-10-CM

## 2023-10-22 DIAGNOSIS — L0292 Furuncle, unspecified: Secondary | ICD-10-CM

## 2023-10-22 DIAGNOSIS — E559 Vitamin D deficiency, unspecified: Secondary | ICD-10-CM | POA: Diagnosis not present

## 2023-10-22 DIAGNOSIS — Z2821 Immunization not carried out because of patient refusal: Secondary | ICD-10-CM

## 2023-10-22 MED ORDER — MUPIROCIN CALCIUM 2 % EX CREA: 1.0000 | TOPICAL_CREAM | Freq: Two times a day (BID) | CUTANEOUS | 0 refills | Status: DC

## 2023-10-22 MED ORDER — MOMETASONE FUROATE 0.1 % EX CREA
1.0000 | TOPICAL_CREAM | Freq: Every day | CUTANEOUS | 0 refills | Status: DC
Start: 2023-10-22 — End: 2023-11-12

## 2023-10-22 NOTE — Progress Notes (Signed)
 Madelaine Bhat, CMA,acting as a Neurosurgeon for Arnette Felts, FNP.,have documented all relevant documentation on the behalf of Arnette Felts, FNP,as directed by  Arnette Felts, FNP while in the presence of Arnette Felts, FNP.  Subjective:    Patient ID: Haley Perez , female    DOB: May 31, 1991 , 33 y.o.   MRN: [id number]  Chief Complaint  Patient presents with   Annual Exam    HPI  Patient presents today for HM, Patient reports compliance with medication. Patient denies any chest pain, SOB, or headaches. Patient reports she has been having eczema flares on her face. She also reports she is having boils on her vagina with one present currently. She is thinking about getting her IUD removed, she does have a GYN     Past Medical History:  Diagnosis Date   Anxiety    BMI 31.0-31.9,adult 01/29/2019   Hypothyroid      Family History  Problem Relation Age of Onset   Thyroid disease Mother    Healthy Father      Current Outpatient Medications:    ALPRAZolam (XANAX) 0.25 MG tablet, Take 0.25 mg by mouth daily as needed., Disp: , Rfl:    B Complex-Folic Acid (B COMPLEX VITAMINS, W/ FA,) CAPS, Take 1 capsule by mouth daily., Disp: , Rfl:    fluticasone (FLONASE) 50 MCG/ACT nasal spray, Place 2 sprays into both nostrils daily., Disp: , Rfl:    levonorgestrel (MIRENA) 20 MCG/DAY IUD, 1 each by Intrauterine route once., Disp: , Rfl:    levothyroxine (SYNTHROID) 150 MCG tablet, Take 150 mcg by mouth daily before breakfast., Disp: , Rfl:    mometasone (ELOCON) 0.1 % cream, Apply 1 Application topically daily., Disp: 45 g, Rfl: 0   mupirocin cream (BACTROBAN) 2 %, Apply 1 Application topically 2 (two) times daily., Disp: 15 g, Rfl: 0   Vitamin D, Ergocalciferol, (DRISDOL) 1.25 MG (50000 UNIT) CAPS capsule, Take 50,000 Units by mouth every 7 (seven) days., Disp: , Rfl:    levothyroxine (SYNTHROID) 137 MCG tablet, Take 137 mcg by mouth daily before breakfast. (Patient not taking: Reported on 10/22/2023),  Disp: , Rfl:    No Known Allergies    The patient states she uses IUD for birth control. No LMP recorded (lmp unknown). (Menstrual status: IUD).. Negative for Dysmenorrhea and Negative for Menorrhagia. Negative for: breast discharge, breast lump(s), breast pain and breast self exam. Associated symptoms include abnormal vaginal bleeding. Pertinent negatives include abnormal bleeding (hematology), anxiety, decreased libido, depression, difficulty falling sleep, dyspareunia, history of infertility, nocturia, sexual dysfunction, sleep disturbances, urinary incontinence, urinary urgency, vaginal discharge and vaginal itching. Diet regular.  The patient states her exercise level is moderate with mostly yard work.     The patient's tobacco use is:  Social History   Tobacco Use  Smoking Status Every Day   Current packs/day: 0.50   Average packs/day: 0.5 packs/day for 5.0 years (2.5 ttl pk-yrs)   Types: Cigarettes  Smokeless Tobacco Never  Tobacco Comments   Smoking 6 cigarettes a day. 8/26 - she is trying to cut back    She has been exposed to passive smoke. The patient's alcohol use is:  Social History   Substance and Sexual Activity  Alcohol Use Yes   Comment: socially every couple weeks  Additional information: Last pap 01/11/2021, next one scheduled for 01/12/2024.    Review of Systems  Constitutional: Negative.   HENT:  Negative for ear pain.   Eyes: Negative.   Respiratory: Negative.  Cardiovascular: Negative.   Gastrointestinal: Negative.   Neurological: Negative.   Psychiatric/Behavioral: Negative.       Today's Vitals   10/22/23 0853  BP: 120/70  Pulse: 90  Temp: 98.6 F (37 C)  TempSrc: Oral  Weight: 156 lb 12.8 oz (71.1 kg)  Height: 5\' 2"  (1.575 m)  PainSc: 0-No pain   Body mass index is 28.68 kg/m.  Wt Readings from Last 3 Encounters:  10/22/23 156 lb 12.8 oz (71.1 kg)  05/22/23 160 lb (72.6 kg)  04/20/23 160 lb 12.8 oz (72.9 kg)     Objective:  Physical  Exam Vitals reviewed.  Constitutional:      General: She is not in acute distress.    Appearance: Normal appearance. She is well-developed and normal weight.  HENT:     Head: Normocephalic and atraumatic.     Right Ear: Hearing, tympanic membrane, ear canal and external ear normal. There is no impacted cerumen.     Left Ear: Hearing, tympanic membrane, ear canal and external ear normal. There is no impacted cerumen.     Nose: Nose normal.     Mouth/Throat:     Mouth: Mucous membranes are moist.  Eyes:     General: Lids are normal.     Extraocular Movements: Extraocular movements intact.     Conjunctiva/sclera: Conjunctivae normal.     Pupils: Pupils are equal, round, and reactive to light.     Funduscopic exam:    Right eye: No papilledema.        Left eye: No papilledema.  Neck:     Thyroid: No thyroid mass.     Vascular: No carotid bruit.  Cardiovascular:     Rate and Rhythm: Normal rate and regular rhythm.     Pulses: Normal pulses.     Heart sounds: Normal heart sounds. No murmur heard. Pulmonary:     Effort: Pulmonary effort is normal. No respiratory distress.     Breath sounds: Normal breath sounds. No wheezing.  Chest:     Chest wall: No mass.  Breasts:    Tanner Score is 5.     Right: Normal. No mass or tenderness.     Left: Normal. No mass or tenderness.  Abdominal:     General: Abdomen is flat. Bowel sounds are normal. There is no distension.     Palpations: Abdomen is soft.     Tenderness: There is no abdominal tenderness.  Genitourinary:    Comments: Deferred - will refer to GYN  Musculoskeletal:        General: No swelling. Normal range of motion.     Cervical back: Full passive range of motion without pain, normal range of motion and neck supple.     Right lower leg: No edema.     Left lower leg: No edema.  Lymphadenopathy:     Upper Body:     Right upper body: No supraclavicular, axillary or pectoral adenopathy.     Left upper body: No supraclavicular,  axillary or pectoral adenopathy.  Skin:    General: Skin is warm and dry.     Capillary Refill: Capillary refill takes less than 2 seconds.  Neurological:     General: No focal deficit present.     Mental Status: She is alert and oriented to person, place, and time.     Cranial Nerves: No cranial nerve deficit.     Sensory: No sensory deficit.  Psychiatric:        Mood and Affect: Mood  normal.        Behavior: Behavior normal.        Thought Content: Thought content normal.        Judgment: Judgment normal.         Assessment And Plan:     Encounter for annual health examination Assessment & Plan: Behavior modifications discussed and diet history reviewed.   Pt will continue to exercise regularly and modify diet with low GI, plant based foods and decrease intake of processed foods.  Recommend intake of daily multivitamin, Vitamin D, and calcium.  Recommend monthly self breast exams for preventive screenings, as well as recommend immunizations that include influenza, TDAP    Hypothyroidism due to Hashimoto's thyroiditis Assessment & Plan: Continue f/u with Endocrinology, no new changes.   Orders: -     CMP14+EGFR  Elevated LDL cholesterol level Assessment & Plan: Will check lipid panel. Diet controlled.   Orders: -     Lipid panel  Vitamin D deficiency Assessment & Plan: Will check vitamin D level and supplement as needed.    Also encouraged to spend 15 minutes in the sun daily.     Recurrent boils Assessment & Plan: She has boils noted  to the groin area and vaginal area, will treat with mupriciron ointment and advised to cleanse with antibacterial soap.   Orders: -     Mupirocin Calcium; Apply 1 Application topically 2 (two) times daily.  Dispense: 15 g; Refill: 0  Rash and nonspecific skin eruption -     Mometasone Furoate; Apply 1 Application topically daily.  Dispense: 45 g; Refill: 0  COVID-19 vaccination declined  Other long term (current) drug  therapy -     CBC with Differential/Platelet   Return for 1 year physical. Patient was given opportunity to ask questions. Patient verbalized understanding of the plan and was able to repeat key elements of the plan. All questions were answered to their satisfaction.   Arnette Felts, FNP  I, Arnette Felts, FNP, have reviewed all documentation for this visit. The documentation on 10/22/23 for the exam, diagnosis, procedures, and orders are all accurate and complete.

## 2023-10-23 LAB — CMP14+EGFR
ALT: 35 [IU]/L — ABNORMAL HIGH (ref 0–32)
AST: 18 [IU]/L (ref 0–40)
Albumin: 4.7 g/dL (ref 4.0–5.0)
Alkaline Phosphatase: 81 [IU]/L (ref 44–121)
BUN/Creatinine Ratio: 17 (ref 9–23)
BUN: 12 mg/dL (ref 6–20)
Bilirubin Total: 1.2 mg/dL (ref 0.0–1.2)
CO2: 19 mmol/L — ABNORMAL LOW (ref 20–29)
Calcium: 9.9 mg/dL (ref 8.7–10.2)
Chloride: 105 mmol/L (ref 96–106)
Creatinine, Ser: 0.69 mg/dL (ref 0.57–1.00)
Globulin, Total: 2.4 g/dL (ref 1.5–4.5)
Glucose: 81 mg/dL (ref 70–99)
Potassium: 4.2 mmol/L (ref 3.5–5.2)
Sodium: 143 mmol/L (ref 134–144)
Total Protein: 7.1 g/dL (ref 6.0–8.5)
eGFR: 120 mL/min/{1.73_m2} (ref >59)

## 2023-10-23 LAB — CBC WITH DIFFERENTIAL/PLATELET
Basophils Absolute: 0 10*3/uL (ref 0.0–0.2)
Basos: 0 %
EOS (ABSOLUTE): 0.2 10*3/uL (ref 0.0–0.4)
Eos: 2 %
Hematocrit: 44.5 % (ref 34.0–46.6)
Hemoglobin: 14.9 g/dL (ref 11.1–15.9)
Immature Grans (Abs): 0 10*3/uL (ref 0.0–0.1)
Immature Granulocytes: 0 %
Lymphocytes Absolute: 2.5 10*3/uL (ref 0.7–3.1)
Lymphs: 25 %
MCH: 31.7 pg (ref 26.6–33.0)
MCHC: 33.5 g/dL (ref 31.5–35.7)
MCV: 95 fL (ref 79–97)
Monocytes Absolute: 0.5 10*3/uL (ref 0.1–0.9)
Monocytes: 5 %
Neutrophils Absolute: 6.6 10*3/uL (ref 1.4–7.0)
Neutrophils: 68 %
Platelets: 300 10*3/uL (ref 150–450)
RBC: 4.7 x10E6/uL (ref 3.77–5.28)
RDW: 11.8 % (ref 11.7–15.4)
WBC: 9.9 10*3/uL (ref 3.4–10.8)

## 2023-10-23 LAB — LIPID PANEL
Chol/HDL Ratio: 3.8 {ratio} (ref 0.0–4.4)
Cholesterol, Total: 173 mg/dL (ref 100–199)
HDL: 45 mg/dL (ref >39)
LDL Chol Calc (NIH): 112 mg/dL — ABNORMAL HIGH (ref 0–99)
Triglycerides: 84 mg/dL (ref 0–149)
VLDL Cholesterol Cal: 16 mg/dL (ref 5–40)

## 2023-11-01 ENCOUNTER — Encounter: Payer: Self-pay | Admitting: Nurse Practitioner

## 2023-11-01 DIAGNOSIS — R21 Rash and other nonspecific skin eruption: Secondary | ICD-10-CM | POA: Insufficient documentation

## 2023-11-01 DIAGNOSIS — Z2821 Immunization not carried out because of patient refusal: Secondary | ICD-10-CM | POA: Insufficient documentation

## 2023-11-01 DIAGNOSIS — L0292 Furuncle, unspecified: Secondary | ICD-10-CM | POA: Insufficient documentation

## 2023-11-01 NOTE — Assessment & Plan Note (Signed)
 Behavior modifications discussed and diet history reviewed.   Pt will continue to exercise regularly and modify diet with low GI, plant based foods and decrease intake of processed foods.  Recommend intake of daily multivitamin, Vitamin D, and calcium.  Recommend monthly self breast exams for preventive screenings, as well as recommend immunizations that include influenza, TDAP

## 2023-11-01 NOTE — Assessment & Plan Note (Signed)
 Will check lipid panel. Diet controlled.

## 2023-11-01 NOTE — Assessment & Plan Note (Signed)
 Continue f/u with Endocrinology, no new changes.

## 2023-11-01 NOTE — Assessment & Plan Note (Signed)
 Will check vitamin D level and supplement as needed.    Also encouraged to spend 15 minutes in the sun daily.

## 2023-11-01 NOTE — Assessment & Plan Note (Signed)
 She has boils noted  to the groin area and vaginal area, will treat with mupriciron ointment and advised to cleanse with antibacterial soap.

## 2023-11-12 ENCOUNTER — Other Ambulatory Visit: Payer: Self-pay

## 2023-11-12 ENCOUNTER — Ambulatory Visit
Admission: EM | Admit: 2023-11-12 | Discharge: 2023-11-12 | Disposition: A | Discharge status: Home / Self Care | Attending: Family Medicine | Admitting: Family Medicine

## 2023-11-12 DIAGNOSIS — R300 Dysuria: Secondary | ICD-10-CM

## 2023-11-12 LAB — POCT URINALYSIS DIP (MANUAL ENTRY)
Bilirubin, UA: NEGATIVE
Blood, UA: NEGATIVE
Glucose, UA: NEGATIVE mg/dL
Ketones, POC UA: NEGATIVE mg/dL
Nitrite, UA: NEGATIVE
Protein Ur, POC: NEGATIVE mg/dL
Spec Grav, UA: <=1.005 — AB (ref 1.010–1.025)
Urobilinogen, UA: 0.2 U/dL
pH, UA: 6 (ref 5.0–8.0)

## 2023-11-12 MED ORDER — NITROFURANTOIN MONOHYD MACRO 100 MG PO CAPS
100.0000 mg | ORAL_CAPSULE | Freq: Two times a day (BID) | ORAL | 0 refills | Status: DC
Start: 2023-11-12 — End: 2023-12-07

## 2023-11-12 NOTE — ED Triage Notes (Signed)
 Pain to left flank the other day, resolved with midol. Noticed cloudy urine this morning and pain to lower abdomen. Urinating frequently.

## 2023-11-12 NOTE — Discharge Instructions (Signed)
 Lots of fluids Take the Macrodantin 2 times a day for 5 days  We have sent your urine specimen to the laboratory for culture.  You will be called if any change in antibiotics is needed

## 2023-11-12 NOTE — ED Provider Notes (Signed)
 [facility]    CSN: [id number] Arrival date & time: 11/12/23  1821      History   Chief Complaint No chief complaint on file.   HPI Haley Perez is a 33 y.o. female.   HPI Patient has had some lower abdominal crampy discomfort.  Dysuria.  Frequency.  This morning she noticed cloudy dark urine and had an increased odor.  She is here for probable UTI.  No vaginal symptoms.  No fever or chills.  No back or flank pain at this time Past Medical History:  Diagnosis Date   Anxiety    BMI 31.0-31.9,adult 01/29/2019   Hypothyroid     Patient Active Problem List   Diagnosis Date Noted   Rash and nonspecific skin eruption 11/01/2023   Recurrent boils 11/01/2023   COVID-19 vaccination declined 11/01/2023   Encounter for annual health examination 10/22/2023   Women's annual routine gynecological examination 05/22/2023   Hypothyroidism due to Hashimoto's thyroiditis 04/20/2023   Elevated LDL cholesterol level 10/20/2022   Vitamin B deficiency 10/17/2022   Vitamin D deficiency 01/30/2020   Tobacco use 04/07/2017   Insomnia 10/31/2016   Seasonal allergies 10/31/2016   Hypothyroidism (acquired) 09/21/2016    Past Surgical History:  Procedure Laterality Date   WISDOM TOOTH EXTRACTION      OB History     Gravida  0   Para  0   Term  0   Preterm  0   AB  0   Living  0      SAB  0   IAB  0   Ectopic  0   Multiple  0   Live Births  0            Home Medications    Prior to Admission medications   Medication Sig Start Date End Date Taking? Authorizing Provider  nitrofurantoin, macrocrystal-monohydrate, (MACROBID) 100 MG capsule Take 1 capsule (100 mg total) by mouth 2 (two) times daily. 11/12/23  Yes Eustace Moore, MD  B Complex-Folic Acid (B COMPLEX VITAMINS, W/ FA,) CAPS Take 1 capsule by mouth daily. 04/22/22   [provider]  levonorgestrel (MIRENA) 20 MCG/DAY IUD 1 each by Intrauterine route once.    [provider]   levothyroxine (SYNTHROID) 150 MCG tablet Take 150 mcg by mouth daily before breakfast. 09/22/23 09/21/24  [provider]  Vitamin D, Ergocalciferol, (DRISDOL) 1.25 MG (50000 UNIT) CAPS capsule Take 50,000 Units by mouth every 7 (seven) days. 08/11/22   [provider]    Family History Family History  Problem Relation Age of Onset   Thyroid disease Mother    Healthy Father     Social History Social History   Tobacco Use   Smoking status: Every Day    Current packs/day: 0.50    Average packs/day: 0.5 packs/day for 5.0 years (2.5 ttl pk-yrs)    Types: Cigarettes   Smokeless tobacco: Never  Vaping Use   Vaping status: Never Used  Substance Use Topics   Alcohol use: Yes    Comment: socially every couple weeks   Drug use: Never     Allergies   Patient has no known allergies.   Review of Systems Review of Systems See HPI  Physical Exam Triage Vital Signs ED Triage Vitals  Encounter Vitals Group     BP 11/12/23 1830 117/80     Systolic BP Percentile --      Diastolic BP Percentile --      Pulse Rate  11/12/23 1830 84     Resp 11/12/23 1830 16     Temp 11/12/23 1830 97.9 F (36.6 C)     Temp src --      SpO2 11/12/23 1830 97 %     Weight --      Height --      Head Circumference --      Peak Flow --      Pain Score 11/12/23 1833 2     Pain Loc --      Pain Education --      Exclude from Growth Chart --    No data found.  Updated Vital Signs BP 117/80   Pulse 84   Temp 97.9 F (36.6 C)   Resp 16   LMP  (LMP Unknown)   SpO2 97%       Physical Exam   UC Treatments / Results  Labs (all labs ordered are listed, but only abnormal results are displayed) Labs Reviewed  POCT URINALYSIS DIP (MANUAL ENTRY) - Abnormal; Notable for the following components:      Result Value   Spec Grav, UA <=1.005 (*)    Leukocytes, UA Trace (*)    All other components within normal limits  URINE CULTURE    EKG   Radiology No results  found.  Procedures Procedures (including critical care time)  Medications Ordered in UC Medications - No data to display  Initial Impression / Assessment and Plan / UC Course  I have reviewed the triage vital signs and the nursing notes.  Pertinent labs & imaging results that were available during my care of the patient were reviewed by me and considered in my medical decision making (see chart for details).     Discussed with patient she has a trace of leukocytes.  This could be a sign of a UTI.  I am going to treat her accordingly and send her urine for culture Final Clinical Impressions(s) / UC Diagnoses   Final diagnoses:  Dysuria     Discharge Instructions      Lots of fluids Take the Macrodantin 2 times a day for 5 days  We have sent your urine specimen to the laboratory for culture.  You will be called if any change in antibiotics is needed   ED Prescriptions     Medication Sig Dispense Auth. Provider   nitrofurantoin, macrocrystal-monohydrate, (MACROBID) 100 MG capsule Take 1 capsule (100 mg total) by mouth 2 (two) times daily. 10 capsule Eustace Moore, MD      PDMP not reviewed this encounter.   Eustace Moore, MD 11/12/23 1901

## 2023-11-13 ENCOUNTER — Telehealth: Payer: Self-pay

## 2023-11-13 NOTE — Telephone Encounter (Signed)
 Received call from microbiology, have to throw away urine culture specimen because it was not labeled properly. Trevor Iha NP notified.

## 2023-12-07 ENCOUNTER — Encounter: Payer: Self-pay | Admitting: Nurse Practitioner

## 2023-12-07 ENCOUNTER — Ambulatory Visit: Admitting: Nurse Practitioner

## 2023-12-07 VITALS — BP 110/76 | HR 78 | Temp 98.5°F | Ht 62.0 in | Wt 157.0 lb

## 2023-12-07 DIAGNOSIS — R21 Rash and other nonspecific skin eruption: Secondary | ICD-10-CM | POA: Diagnosis not present

## 2023-12-07 DIAGNOSIS — Z6828 Body mass index (BMI) 28.0-28.9, adult: Secondary | ICD-10-CM | POA: Diagnosis not present

## 2023-12-07 DIAGNOSIS — Z2821 Immunization not carried out because of patient refusal: Secondary | ICD-10-CM

## 2023-12-07 DIAGNOSIS — E663 Overweight: Secondary | ICD-10-CM | POA: Diagnosis not present

## 2023-12-07 MED ORDER — TRIAMCINOLONE ACETONIDE 40 MG/ML IJ SUSP
60.0000 mg | Freq: Once | INTRAMUSCULAR | Status: AC
Start: 2023-12-07 — End: 2023-12-07
  Administered 2023-12-07: 60 mg via INTRAMUSCULAR

## 2023-12-07 MED ORDER — PREDNISONE 10 MG (21) PO TBPK: ORAL_TABLET | ORAL | 0 refills | Status: DC

## 2023-12-07 NOTE — Progress Notes (Signed)
 Del Favia, CMA,acting as a Neurosurgeon for Haley Epley, FNP.,have documented all relevant documentation on the behalf of Haley Epley, FNP,as directed by  Haley Epley, FNP while in the presence of Haley Epley, FNP.  Subjective:  Patient ID: Haley Perez , female    DOB: [birth year] , 33 y.o.   MRN: [id number]  Chief Complaint  Patient presents with   Rash    HPI  Patient presents today for a rash on forearms. Patient reports she was doing some yard work but is unsure if she got in poison ivy. Patient reports it is very itchy and it is spreading. Patient used OTC creams, benadryl or soaps with no relief. She first noticed about a week ago.       Past Medical History:  Diagnosis Date   Anxiety    BMI 31.0-31.9,adult 01/29/2019   Depression    Hypothyroid      Family History  Problem Relation Age of Onset   Thyroid  disease Mother    Healthy Father    Alcohol abuse Father    Heart disease Maternal Grandfather    Diabetes Maternal Grandmother    Miscarriages / Stillbirths Maternal Grandmother      Current Outpatient Medications:    B Complex-Folic Acid (B COMPLEX VITAMINS, W/ FA,) CAPS, Take 1 capsule by mouth daily., Disp: , Rfl:    levonorgestrel  (MIRENA ) 20 MCG/DAY IUD, 1 each by Intrauterine route once., Disp: , Rfl:    levothyroxine  (SYNTHROID ) 150 MCG tablet, Take 150 mcg by mouth daily before breakfast., Disp: , Rfl:    predniSONE  (STERAPRED UNI-PAK 21 TAB) 10 MG (21) TBPK tablet, Take as directed, Disp: 21 tablet, Rfl: 0   Vitamin D , Ergocalciferol , (DRISDOL) 1.25 MG (50000 UNIT) CAPS capsule, Take 50,000 Units by mouth every 7 (seven) days., Disp: , Rfl:    No Known Allergies   Review of Systems  Constitutional: Negative.   Respiratory: Negative.    Gastrointestinal:  Negative for abdominal distention.  Skin:  Positive for rash (red raised rash to bilateral arms and going down her body).  All other systems reviewed and are negative.    Today's Vitals    12/07/23 1537  BP: 110/76  Pulse: 78  Temp: 98.5 F (36.9 C)  TempSrc: Oral  Weight: 157 lb (71.2 kg)  Height: 5\' 2"  (1.575 m)  PainSc: 0-No pain   Body mass index is 28.72 kg/m.  Wt Readings from Last 3 Encounters:  12/07/23 157 lb (71.2 kg)  10/22/23 156 lb 12.8 oz (71.1 kg)  05/22/23 160 lb (72.6 kg)      Objective:  Physical Exam Vitals reviewed.  Constitutional:      General: She is not in acute distress.    Appearance: Normal appearance.  Cardiovascular:     Pulses: Normal pulses.     Heart sounds: Normal heart sounds. No murmur heard. Pulmonary:     Effort: Pulmonary effort is normal. No respiratory distress.     Breath sounds: Normal breath sounds.  Skin:    Capillary Refill: Capillary refill takes less than 2 seconds.     Findings: Rash (bilateral arms, erythema and slightly raised) present.  Neurological:     General: No focal deficit present.     Mental Status: She is alert and oriented to person, place, and time.     Cranial Nerves: No cranial nerve deficit.     Motor: No weakness.         Assessment And Plan:  Rash Assessment &  Plan: Erythema to bilateral arms and slightly raised, unsure if poison ivy/oak or allergic reaction to foods. Will treat with steroid and cream  Orders: -     Triamcinolone  Acetonide -     predniSONE ; Take as directed  Dispense: 21 tablet; Refill: 0  COVID-19 vaccination declined Assessment & Plan: Declines covid 19 vaccine. Discussed risk of covid 81 and if she changes her mind about the vaccine to call the office. Education has been provided regarding the importance of this vaccine but patient still declined. Advised may receive this vaccine at local pharmacy or Health Dept.or vaccine clinic. Aware to provide a copy of the vaccination record if obtained from local pharmacy or Health Dept.  Encouraged to take multivitamin, vitamin d , vitamin c and zinc to increase immune system. Aware can call office if would like to have  vaccine here at office. Verbalized acceptance and understanding.    Overweight with body mass index (BMI) of 28 to 28.9 in adult    Return for keep same next.  Patient was given opportunity to ask questions. Patient verbalized understanding of the plan and was able to repeat key elements of the plan. All questions were answered to their satisfaction.    Inge Mangle, FNP, have reviewed all documentation for this visit. The documentation on 12/07/23 for the exam, diagnosis, procedures, and orders are all accurate and complete.   IF YOU HAVE BEEN REFERRED TO A SPECIALIST, IT MAY TAKE 1-2 WEEKS TO SCHEDULE/PROCESS THE REFERRAL. IF YOU HAVE NOT HEARD FROM US /SPECIALIST IN TWO WEEKS, PLEASE GIVE US  A CALL AT [phone].

## 2023-12-13 NOTE — Assessment & Plan Note (Signed)

## 2023-12-13 NOTE — Assessment & Plan Note (Signed)
 Erythema to bilateral arms and slightly raised, unsure if poison ivy/oak or allergic reaction to foods. Will treat with steroid and cream

## 2024-04-06 ENCOUNTER — Ambulatory Visit
Admission: EM | Admit: 2024-04-06 | Discharge: 2024-04-06 | Disposition: A | Discharge status: Home / Self Care | Attending: Family Medicine | Admitting: Family Medicine

## 2024-04-06 DIAGNOSIS — H6692 Otitis media, unspecified, left ear: Secondary | ICD-10-CM

## 2024-04-06 DIAGNOSIS — H9202 Otalgia, left ear: Secondary | ICD-10-CM

## 2024-04-06 MED ORDER — AMOXICILLIN-POT CLAVULANATE 875-125 MG PO TABS: 1.0000 | ORAL_TABLET | Freq: Two times a day (BID) | ORAL | 0 refills | Status: AC

## 2024-04-06 NOTE — ED Provider Notes (Signed)
 [name]    CSN: [id number] Arrival date & time: 04/06/24  1739      History   Chief Complaint Chief Complaint  Patient presents with   Otalgia    LT    HPI Myrtis Maille is a 33 y.o. female.   HPI 33 year old female presents with left ear pain x 2 days.  PMH significant for anxiety and hyperthyroidism.  Past Medical History:  Diagnosis Date   Anxiety    BMI 31.0-31.9,adult 01/29/2019   Depression    Hypothyroid     Patient Active Problem List   Diagnosis Date Noted   Rash 12/07/2023   Overweight with body mass index (BMI) of 28 to 28.9 in adult 12/07/2023   Rash and nonspecific skin eruption 11/01/2023   Recurrent boils 11/01/2023   COVID-19 vaccination declined 11/01/2023   Encounter for annual health examination 10/22/2023   Women's annual routine gynecological examination 05/22/2023   Hypothyroidism due to Hashimoto's thyroiditis 04/20/2023   Elevated LDL cholesterol level 10/20/2022   Vitamin B deficiency 10/17/2022   Vitamin D  deficiency 01/30/2020   Tobacco use 04/07/2017   Insomnia 10/31/2016   Seasonal allergies 10/31/2016   Hypothyroidism (acquired) 09/21/2016    Past Surgical History:  Procedure Laterality Date   WISDOM TOOTH EXTRACTION      OB History     Gravida  0   Para  0   Term  0   Preterm  0   AB  0   Living  0      SAB  0   IAB  0   Ectopic  0   Multiple  0   Live Births  0            Home Medications    Prior to Admission medications   Medication Sig Start Date End Date Taking? Authorizing Provider  amoxicillin -clavulanate (AUGMENTIN ) 875-125 MG tablet Take 1 tablet by mouth 2 (two) times daily for 10 days. 04/06/24 04/16/24 Yes Teddy Sharper, FNP  B Complex-Folic Acid (B COMPLEX VITAMINS, W/ FA,) CAPS Take 1 capsule by mouth daily. 04/22/22   [provider]  levonorgestrel  (MIRENA ) 20 MCG/DAY IUD 1 each by Intrauterine route once.    [provider]  levothyroxine   (SYNTHROID ) 150 MCG tablet Take 150 mcg by mouth daily before breakfast. 09/22/23 09/21/24  [provider]  Vitamin D , Ergocalciferol , (DRISDOL) 1.25 MG (50000 UNIT) CAPS capsule Take 50,000 Units by mouth every 7 (seven) days. 08/11/22   [provider]    Family History Family History  Problem Relation Age of Onset   Thyroid  disease Mother    Healthy Father    Alcohol abuse Father    Heart disease Maternal Grandfather    Diabetes Maternal Grandmother    Miscarriages / Stillbirths Maternal Grandmother     Social History Social History   Tobacco Use   Smoking status: Every Day    Current packs/day: 0.50    Average packs/day: 0.5 packs/day for 5.0 years (2.5 ttl pk-yrs)    Types: Cigarettes   Smokeless tobacco: Never  Vaping Use   Vaping status: Never Used  Substance Use Topics   Alcohol use: Yes    Comment: socially every couple weeks   Drug use: Never     Allergies   Patient has no known allergies.   Review of Systems Review of Systems  HENT:  Positive for ear pain.   All other systems reviewed and are negative.    Physical Exam Triage  Vital Signs ED Triage Vitals  Encounter Vitals Group     BP      Girls Systolic BP Percentile      Girls Diastolic BP Percentile      Boys Systolic BP Percentile      Boys Diastolic BP Percentile      Pulse      Resp      Temp      Temp src      SpO2      Weight      Height      Head Circumference      Peak Flow      Pain Score      Pain Loc      Pain Education      Exclude from Growth Chart    No data found.  Updated Vital Signs BP 122/77 (BP Location: Right Arm)   Pulse 87   Temp 98.5 F (36.9 C) (Oral)   Resp 17   SpO2 98%    Physical Exam Vitals and nursing note reviewed.  Constitutional:      Appearance: Normal appearance. She is normal weight.  HENT:     Head: Normocephalic.     Right Ear: Tympanic membrane, ear canal and external ear normal.     Left Ear: Ear canal and  external ear normal.     Ears:     Comments: Left TM: Erythematous, bulging    Mouth/Throat:     Mouth: Mucous membranes are moist.     Pharynx: Oropharynx is clear.  Eyes:     Extraocular Movements: Extraocular movements intact.     Conjunctiva/sclera: Conjunctivae normal.     Pupils: Pupils are equal, round, and reactive to light.  Cardiovascular:     Pulses: Normal pulses.     Heart sounds: Normal heart sounds.  Pulmonary:     Effort: Pulmonary effort is normal.     Breath sounds: Normal breath sounds. No wheezing, rhonchi or rales.  Musculoskeletal:        General: Normal range of motion.  Skin:    General: Skin is warm and dry.  Neurological:     General: No focal deficit present.     Mental Status: She is alert and oriented to person, place, and time. Mental status is at baseline.  Psychiatric:        Mood and Affect: Mood normal.        Behavior: Behavior normal.      UC Treatments / Results  Labs (all labs ordered are listed, but only abnormal results are displayed) Labs Reviewed - No data to display  EKG   Radiology No results found.  Procedures Procedures (including critical care time)  Medications Ordered in UC Medications - No data to display  Initial Impression / Assessment and Plan / UC Course  I have reviewed the triage vital signs and the nursing notes.  Pertinent labs & imaging results that were available during my care of the patient were reviewed by me and considered in my medical decision making (see chart for details).     MDM: 1.  Acute left otitis media-Rx'd Augmentin  875/125 mg tablet: Take 1 tablet twice daily x 10 days; 2.  Otalgia of left ear-Advised patient take medication as directed with food to completion.  Advised patient may take OTC Ibuprofen 400 to 800 mg every 6-8 hours for left ear pain.  Encouraged to increase daily water intake to 64 ounces per day while taking these  medications.  Advised if symptoms worsen and/or unresolved  please follow-up with your PCP or here for further evaluation.  Patient discharged home, hemodynamically stable. Final Clinical Impressions(s) / UC Diagnoses   Final diagnoses:  Acute left otitis media  Otalgia of left ear     Discharge Instructions      Advised patient take medication as directed with food to completion.  Advised patient may take OTC Ibuprofen 400 to 800 mg every 6-8 hours for left ear pain.  Encouraged to increase daily water intake to 64 ounces per day while taking these medications.  Advised if symptoms worsen and/or unresolved please follow-up with your PCP or here for further evaluation.     ED Prescriptions     Medication Sig Dispense Auth. Provider   amoxicillin -clavulanate (AUGMENTIN ) 875-125 MG tablet Take 1 tablet by mouth 2 (two) times daily for 10 days. 20 tablet David Rodriquez, FNP      PDMP not reviewed this encounter.   Teddy Sharper, FNP 04/06/24 1814

## 2024-04-06 NOTE — Discharge Instructions (Addendum)
 Advised patient take medication as directed with food to completion.  Advised patient may take OTC Ibuprofen 400 to 800 mg every 6-8 hours for left ear pain.  Encouraged to increase daily water intake to 64 ounces per day while taking these medications.  Advised if symptoms worsen and/or unresolved please follow-up with your PCP or here for further evaluation.

## 2024-04-06 NOTE — ED Triage Notes (Signed)
 Pt c/o LT ear pain x 2 days. Some tenderness behind ear. Post nasal drainage. Benedryl and ibuprofen prn.

## 2024-05-21 ENCOUNTER — Ambulatory Visit
Admission: EM | Admit: 2024-05-21 | Discharge: 2024-05-21 | Disposition: A | Discharge status: Home / Self Care | Attending: Family Medicine | Admitting: Family Medicine

## 2024-05-21 DIAGNOSIS — H60319 Diffuse otitis externa, unspecified ear: Secondary | ICD-10-CM

## 2024-05-21 MED ORDER — CIPROFLOXACIN-DEXAMETHASONE 0.3-0.1 % OT SUSP: 4.0000 [drp] | Freq: Two times a day (BID) | OTIC | 0 refills | Status: AC

## 2024-05-21 MED ORDER — AMOXICILLIN-POT CLAVULANATE 875-125 MG PO TABS: 1.0000 | ORAL_TABLET | Freq: Two times a day (BID) | ORAL | 0 refills | Status: DC

## 2024-05-21 NOTE — ED Provider Notes (Signed)
 [facility]    CSN: [id number] Arrival date & time: 05/21/24  1404      History   Chief Complaint Chief Complaint  Patient presents with   Otalgia    HPI Haley Perez is a 33 y.o. female.   Patient has had a cold for several days.  Now has bilateral ear pain.  Right greater than left.  She states she is prone to ear infections.  Decreased hearing on right.    Past Medical History:  Diagnosis Date   Anxiety    BMI 31.0-31.9,adult 01/29/2019   Depression    Hypothyroid     Patient Active Problem List   Diagnosis Date Noted   Rash 12/07/2023   Overweight with body mass index (BMI) of 28 to 28.9 in adult 12/07/2023   Rash and nonspecific skin eruption 11/01/2023   Recurrent boils 11/01/2023   COVID-19 vaccination declined 11/01/2023   Encounter for annual health examination 10/22/2023   Women's annual routine gynecological examination 05/22/2023   Hypothyroidism due to Hashimoto's thyroiditis 04/20/2023   Elevated LDL cholesterol level 10/20/2022   Vitamin B deficiency 10/17/2022   Vitamin D  deficiency 01/30/2020   Tobacco use 04/07/2017   Insomnia 10/31/2016   Seasonal allergies 10/31/2016   Hypothyroidism (acquired) 09/21/2016    Past Surgical History:  Procedure Laterality Date   WISDOM TOOTH EXTRACTION      OB History     Gravida  0   Para  0   Term  0   Preterm  0   AB  0   Living  0      SAB  0   IAB  0   Ectopic  0   Multiple  0   Live Births  0            Home Medications    Prior to Admission medications   Medication Sig Start Date End Date Taking? Authorizing Provider  amoxicillin -clavulanate (AUGMENTIN ) 875-125 MG tablet Take 1 tablet by mouth every 12 (twelve) hours. 05/21/24  Yes Maranda Jamee Jacob, MD  B Complex-Folic Acid (B COMPLEX VITAMINS, W/ FA,) CAPS Take 1 capsule by mouth daily. 04/22/22  Yes [provider]  ciprofloxacin-dexamethasone (CIPRODEX) OTIC suspension Place 4 drops into both  ears 2 (two) times daily. 05/21/24  Yes Maranda Jamee Jacob, MD  levonorgestrel  (MIRENA ) 20 MCG/DAY IUD 1 each by Intrauterine route once.   Yes [provider]  levothyroxine  (SYNTHROID ) 150 MCG tablet Take 150 mcg by mouth daily before breakfast. 09/22/23 09/21/24 Yes [provider]  Vitamin D , Ergocalciferol , (DRISDOL) 1.25 MG (50000 UNIT) CAPS capsule Take 50,000 Units by mouth every 7 (seven) days. 08/11/22  Yes [provider]    Family History Family History  Problem Relation Age of Onset   Thyroid  disease Mother    Healthy Father    Alcohol abuse Father    Heart disease Maternal Grandfather    Diabetes Maternal Grandmother    Miscarriages / Stillbirths Maternal Grandmother     Social History Social History   Tobacco Use   Smoking status: Every Day    Current packs/day: 0.50    Average packs/day: 0.5 packs/day for 5.0 years (2.5 ttl pk-yrs)    Types: Cigarettes   Smokeless tobacco: Never  Vaping Use   Vaping status: Never Used  Substance Use Topics   Alcohol use: Yes    Comment: socially every couple weeks   Drug use: Never     Allergies   Patient has  no known allergies.   Review of Systems Review of Systems  See HPI Physical Exam Triage Vital Signs ED Triage Vitals  Encounter Vitals Group     BP 05/21/24 1423 109/73     Girls Systolic BP Percentile --      Girls Diastolic BP Percentile --      Boys Systolic BP Percentile --      Boys Diastolic BP Percentile --      Pulse Rate 05/21/24 1423 86     Resp 05/21/24 1423 19     Temp 05/21/24 1423 99 F (37.2 C)     Temp Source 05/21/24 1423 Oral     SpO2 05/21/24 1423 98 %     Weight 05/21/24 1421 155 lb (70.3 kg)     Height 05/21/24 1421 5' (1.524 m)     Head Circumference --      Peak Flow --      Pain Score 05/21/24 1421 1     Pain Loc --      Pain Education --      Exclude from Growth Chart --    No data found.  Updated Vital Signs BP 109/73 (BP Location: Left Arm)    Pulse 86   Temp 99 F (37.2 C) (Oral)   Resp 19   Ht 5' (1.524 m)   Wt 70.3 kg   LMP 05/21/2024   SpO2 98%   BMI 30.27 kg/m      Physical Exam Constitutional:      General: She is not in acute distress.    Appearance: She is well-developed.  HENT:     Head: Normocephalic and atraumatic.     Ears:     Comments: There is pain with traction of right pinna.  The right TM is obscured by swelling and exudate in the canal.  The left pinna is not tender.  Canal is clear.  The TM is injected and dull    Nose: Nose normal. No congestion.     Mouth/Throat:     Mouth: Mucous membranes are moist.     Pharynx: No posterior oropharyngeal erythema.  Eyes:     Conjunctiva/sclera: Conjunctivae normal.     Pupils: Pupils are equal, round, and reactive to light.  Cardiovascular:     Rate and Rhythm: Normal rate.  Pulmonary:     Effort: Pulmonary effort is normal. No respiratory distress.  Abdominal:     General: There is no distension.     Palpations: Abdomen is soft.  Musculoskeletal:        General: Normal range of motion.     Cervical back: Normal range of motion.  Lymphadenopathy:     Cervical: Cervical adenopathy present.  Skin:    General: Skin is warm and dry.  Neurological:     Mental Status: She is alert.      UC Treatments / Results  Labs (all labs ordered are listed, but only abnormal results are displayed) Labs Reviewed - No data to display  EKG   Radiology No results found.  Procedures Procedures (including critical care time)  Medications Ordered in UC Medications - No data to display  Initial Impression / Assessment and Plan / UC Course  I have reviewed the triage vital signs and the nursing notes.  Pertinent labs & imaging results that were available during my care of the patient were reviewed by me and considered in my medical decision making (see chart for details).     It appears  she has an otitis externa on the right and otitis media on the left.   I have concern that she may have had an otitis media on the right that ruptured, and that is the reason she has a lot of fluid in the canal.  In any event we will treat her with both antibiotics and eardrops.  Needs to follow-up with her PCP in 2 weeks Final Clinical Impressions(s) / UC Diagnoses   Final diagnoses:  Diffuse otitis externa, unspecified chronicity, unspecified laterality     Discharge Instructions      Take the antibiotic 2 times a day.  Take with food Use the eardrops 2 times a day May continue over-the-counter cough and cold medicine as needed May take ibuprofen, Tylenol, or naproxen as needed for pain See your doctor if not improving by next week   ED Prescriptions     Medication Sig Dispense Auth. Provider   amoxicillin -clavulanate (AUGMENTIN ) 875-125 MG tablet Take 1 tablet by mouth every 12 (twelve) hours. 14 tablet Maranda Jamee Jacob, MD   ciprofloxacin-dexamethasone [facility]) OTIC suspension Place 4 drops into both ears 2 (two) times daily. 7.5 mL Maranda Jamee Jacob, MD      PDMP not reviewed this encounter.   Maranda Jamee Jacob, MD 05/21/24 [phone]

## 2024-05-21 NOTE — Discharge Instructions (Signed)
 Take the antibiotic 2 times a day.  Take with food Use the eardrops 2 times a day May continue over-the-counter cough and cold medicine as needed May take ibuprofen, Tylenol, or naproxen as needed for pain See your doctor if not improving by next week

## 2024-05-21 NOTE — ED Triage Notes (Signed)
 Pt states that she has bilateral ear pain. X3 days

## 2024-05-30 ENCOUNTER — Ambulatory Visit
Admission: EM | Admit: 2024-05-30 | Discharge: 2024-05-30 | Disposition: A | Discharge status: Home / Self Care | Attending: Family Medicine | Admitting: Family Medicine

## 2024-05-30 ENCOUNTER — Encounter: Payer: Self-pay | Admitting: Emergency Medicine

## 2024-05-30 DIAGNOSIS — H7291 Unspecified perforation of tympanic membrane, right ear: Secondary | ICD-10-CM

## 2024-05-30 DIAGNOSIS — H9201 Otalgia, right ear: Secondary | ICD-10-CM

## 2024-05-30 NOTE — ED Provider Notes (Signed)
 [name]    CSN: [id number] Arrival date & time: 05/30/24  1819      History   Chief Complaint Chief Complaint  Patient presents with   Otalgia    HPI Haley Perez is a 33 y.o. female.   HPI  I saw this for a little over a week ago for an ear infection.  She had a swollen outer ear canal with some drainage.  I placed her on antibiotics and eardrops to try to clear, since I could not see the eardrum.  She states that the swelling and infection have gone down but she has noted drainage out of her right ear.  Hearing is normal.  Past Medical History:  Diagnosis Date   Anxiety    BMI 31.0-31.9,adult 01/29/2019   Depression    Hypothyroid     Patient Active Problem List   Diagnosis Date Noted   Rash 12/07/2023   Overweight with body mass index (BMI) of 28 to 28.9 in adult 12/07/2023   Rash and nonspecific skin eruption 11/01/2023   Recurrent boils 11/01/2023   COVID-19 vaccination declined 11/01/2023   Encounter for annual health examination 10/22/2023   Women's annual routine gynecological examination 05/22/2023   Hypothyroidism due to Hashimoto's thyroiditis 04/20/2023   Elevated LDL cholesterol level 10/20/2022   Vitamin B deficiency 10/17/2022   Vitamin D  deficiency 01/30/2020   Tobacco use 04/07/2017   Insomnia 10/31/2016   Seasonal allergies 10/31/2016   Hypothyroidism (acquired) 09/21/2016    Past Surgical History:  Procedure Laterality Date   WISDOM TOOTH EXTRACTION      OB History     Gravida  0   Para  0   Term  0   Preterm  0   AB  0   Living  0      SAB  0   IAB  0   Ectopic  0   Multiple  0   Live Births  0            Home Medications    Prior to Admission medications   Medication Sig Start Date End Date Taking? Authorizing Provider  B Complex-Folic Acid (B COMPLEX VITAMINS, W/ FA,) CAPS Take 1 capsule by mouth daily. 04/22/22  Yes [provider]  ciprofloxacin-dexamethasone (CIPRODEX) OTIC  suspension Place 4 drops into both ears 2 (two) times daily. 05/21/24  Yes Maranda Jamee Jacob, MD  levonorgestrel  (MIRENA ) 20 MCG/DAY IUD 1 each by Intrauterine route once.   Yes [provider]  levothyroxine  (SYNTHROID ) 150 MCG tablet Take 150 mcg by mouth daily before breakfast. 09/22/23 09/21/24 Yes [provider]  Vitamin D , Ergocalciferol , (DRISDOL) 1.25 MG (50000 UNIT) CAPS capsule Take 50,000 Units by mouth every 7 (seven) days. 08/11/22  Yes [provider]    Family History Family History  Problem Relation Age of Onset   Thyroid  disease Mother    Healthy Father    Alcohol abuse Father    Heart disease Maternal Grandfather    Diabetes Maternal Grandmother    Miscarriages / Stillbirths Maternal Grandmother     Social History Social History   Tobacco Use   Smoking status: Every Day    Current packs/day: 0.50    Average packs/day: 0.5 packs/day for 5.0 years (2.5 ttl pk-yrs)    Types: Cigarettes   Smokeless tobacco: Never  Vaping Use   Vaping status: Never Used  Substance Use Topics   Alcohol use: Yes    Comment: socially every couple weeks  Drug use: Never     Allergies   Patient has no known allergies.   Review of Systems Review of Systems See HPI  Physical Exam Triage Vital Signs ED Triage Vitals  Encounter Vitals Group     BP 05/30/24 1828 116/78     Girls Systolic BP Percentile --      Girls Diastolic BP Percentile --      Boys Systolic BP Percentile --      Boys Diastolic BP Percentile --      Pulse Rate 05/30/24 1828 79     Resp 05/30/24 1828 18     Temp 05/30/24 1828 99.2 F (37.3 C)     Temp Source 05/30/24 1828 Oral     SpO2 05/30/24 1828 97 %     Weight 05/30/24 1830 155 lb (70.3 kg)     Height 05/30/24 1830 5' (1.524 m)     Head Circumference --      Peak Flow --      Pain Score 05/30/24 1830 0     Pain Loc --      Pain Education --      Exclude from Growth Chart --    No data found.  Updated Vital  Signs BP 116/78 (BP Location: Right Arm)   Pulse 79   Temp 99.2 F (37.3 C) (Oral)   Resp 18   Ht 5' (1.524 m)   Wt 70.3 kg   LMP 05/21/2024   SpO2 97%   BMI 30.27 kg/m       Physical Exam Constitutional:      General: She is not in acute distress.    Appearance: She is well-developed.  HENT:     Head: Normocephalic and atraumatic.     Ears:     Comments: Scant bloody fluid in right external ear canal.  Perforation of TM. Eyes:     Conjunctiva/sclera: Conjunctivae normal.     Pupils: Pupils are equal, round, and reactive to light.  Cardiovascular:     Rate and Rhythm: Normal rate.  Pulmonary:     Effort: Pulmonary effort is normal. No respiratory distress.  Musculoskeletal:        General: Normal range of motion.     Cervical back: Normal range of motion.  Skin:    General: Skin is warm and dry.  Neurological:     Mental Status: She is alert.      UC Treatments / Results  Labs (all labs ordered are listed, but only abnormal results are displayed) Labs Reviewed - No data to display  EKG   Radiology No results found.  Procedures Procedures (including critical care time)  Medications Ordered in UC Medications - No data to display  Initial Impression / Assessment and Plan / UC Course  I have reviewed the triage vital signs and the nursing notes.  Pertinent labs & imaging results that were available during my care of the patient were reviewed by me and considered in my medical decision making (see chart for details).     Final Clinical Impressions(s) / UC Diagnoses   Final diagnoses:  Otalgia of right ear  Perforation of right tympanic membrane     Discharge Instructions      Use ear drops an additional 5 days See ENT in follow up   ED Prescriptions   None    PDMP not reviewed this encounter.   Maranda Jamee Jacob, MD 05/30/24 1901

## 2024-05-30 NOTE — Discharge Instructions (Signed)
 Use ear drops an additional 5 days See ENT in follow up

## 2024-05-30 NOTE — ED Triage Notes (Signed)
 Patient states that she was just prescribed antibiotics and ear drops for right ear infection on 05/21/2024.  Patient completed medications and noticed blood coming from same ear last night and today.

## 2024-08-23 ENCOUNTER — Encounter: Payer: Self-pay | Admitting: Obstetrics and Gynecology

## 2024-08-23 ENCOUNTER — Other Ambulatory Visit (HOSPITAL_COMMUNITY)
Admission: RE | Admit: 2024-08-23 | Discharge: 2024-08-23 | Disposition: A | Discharge status: Home / Self Care | Source: Ambulatory Visit | Attending: Obstetrics and Gynecology | Admitting: Obstetrics and Gynecology

## 2024-08-23 ENCOUNTER — Ambulatory Visit: Admitting: Obstetrics and Gynecology

## 2024-08-23 VITALS — BP 124/73 | HR 90 | Ht 60.0 in | Wt 160.0 lb

## 2024-08-23 DIAGNOSIS — R102 Pelvic and perineal pain unspecified side: Secondary | ICD-10-CM | POA: Diagnosis present

## 2024-08-23 DIAGNOSIS — Z01419 Encounter for gynecological examination (general) (routine) without abnormal findings: Secondary | ICD-10-CM

## 2024-08-23 LAB — CERVICOVAGINAL ANCILLARY ONLY
Bacterial Vaginitis (gardnerella): NEGATIVE
Candida Glabrata: NEGATIVE
Candida Vaginitis: NEGATIVE
Comment: NEGATIVE
Comment: NEGATIVE
Comment: NEGATIVE

## 2024-08-23 NOTE — Progress Notes (Signed)
 "   GYNECOLOGY ANNUAL PREVENTATIVE CARE ENCOUNTER NOTE  History:     Haley Perez is a 33 y.o. G0P0000 female here for a routine annual gynecologic exam.  Current complaints: Pelvic pain x 2 months. Happens randomly. Feels like shooting pain in her pelvis. Denies abnormal vaginal bleeding, discharge, pelvic pain, problems with intercourse or other gynecologic concerns.    Gynecologic History No LMP recorded. (Menstrual status: IUD). Contraception: IUD Last Pap: 2022. Result was normal with negative HPV Last Mammogram: NA Obstetric History OB History  Gravida Para Term Preterm AB Living  0 0 0 0 0 0  SAB IAB Ectopic Multiple Live Births  0 0 0 0 0    Past Medical History:  Diagnosis Date   Anxiety    BMI 31.0-31.9,adult 01/29/2019   Depression    Hypothyroid     Past Surgical History:  Procedure Laterality Date   WISDOM TOOTH EXTRACTION      Medications Ordered Prior to Encounter[1]  Allergies[2]  Social History:  reports that she has been smoking cigarettes. She has a 2.5 pack-year smoking history. She has never used smokeless tobacco. She reports current alcohol use. She reports that she does not use drugs.  Family History  Problem Relation Age of Onset   Diabetes Maternal Grandmother    Miscarriages / Stillbirths Maternal Grandmother    Cancer Maternal Grandfather 56       kidney cancer   Heart disease Maternal Grandfather    Alcohol abuse Father    Thyroid  disease Mother     The following portions of the patient's history were reviewed and updated as appropriate: allergies, current medications, past family history, past medical history, past social history, past surgical history and problem list.  Review of Systems Pertinent items noted in HPI and remainder of comprehensive ROS otherwise negative.  Physical Exam:  BP 124/73   Pulse 90   Ht 5' (1.524 m)   Wt 160 lb (72.6 kg)   BMI 31.25 kg/m  CONSTITUTIONAL: Well-developed, well-nourished female in no  acute distress.  HENT:  Normocephalic, atraumatic, External right and left ear normal.  EYES: Conjunctivae and EOM are normal. Pupils are equal, round, and reactive to light. No scleral icterus.  NECK: Normal range of motion, supple, no masses.  Normal thyroid .  SKIN: Skin is warm and dry. No rash noted. Not diaphoretic. No erythema. No pallor. MUSCULOSKELETAL: Normal range of motion. No tenderness.  No cyanosis, clubbing, or edema. NEUROLOGIC: Alert and oriented to person, place, and time. Normal reflexes, muscle tone coordination.  PSYCHIATRIC: Normal mood and affect. Normal behavior. Normal judgment and thought content. CARDIOVASCULAR: Normal heart rate noted, regular rhythm RESPIRATORY: Clear to auscultation bilaterally. Effort and breath sounds normal, no problems with respiration noted. BREASTS: Symmetric in size. No masses, tenderness, skin changes, nipple drainage, or lymphadenopathy bilaterally. Performed in the presence of a chaperone. ABDOMEN: Soft, no distention noted.  No tenderness, rebound or guarding.  PELVIC: Normal appearing external genitalia and urethral meatus; normal appearing vaginal mucosa and cervix.  No abnormal vaginal discharge noted.  Pap smear obtained: IUD strings visualized however 3-4 cm in length. Trimmed strings.   Normal uterine size, no other palpable masses, no uterine or adnexal tenderness.  Performed in the presence of a chaperone.   Assessment and Plan:    1. Well woman exam with routine gynecological exam (Primary)  - Cytology - PAP - Cervicovaginal ancillary only(  [place]) - Urine Culture - US  PELVIC COMPLETE WITH TRANSVAGINAL; Future, could be displacement  of IUD.   2. Pelvic pain  - Cervicovaginal ancillary only( [place]) - Urine Culture - US  PELVIC COMPLETE WITH TRANSVAGINAL; Future  Will follow up results of pap smear and manage accordingly. Normal breast examination today, she was advised to perform periodic self breast  examinations.  Routine preventative health maintenance measures emphasized. Please refer to After Visit Summary for other counseling recommendations.    Malania Gawthrop, Delon FERNS, NP [facility] [employer], [facility]      [1]  Current Outpatient Medications on File Prior to Visit  Medication Sig Dispense Refill   B Complex-Folic Acid (B COMPLEX VITAMINS, W/ FA,) CAPS Take 1 capsule by mouth daily.     levonorgestrel  (MIRENA ) 20 MCG/DAY IUD 1 each by Intrauterine route once.     levothyroxine  (SYNTHROID ) 150 MCG tablet Take 150 mcg by mouth daily before breakfast.     Omega-3 Fatty Acids (OMEGA III EPA+DHA) 1000 MG CAPS Take 1 each by mouth daily.     Vitamin D , Ergocalciferol , (DRISDOL) 1.25 MG (50000 UNIT) CAPS capsule Take 50,000 Units by mouth every 7 (seven) days.     ciprofloxacin -dexamethasone  (CIPRODEX ) OTIC suspension Place 4 drops into both ears 2 (two) times daily. (Patient not taking: Reported on 08/23/2024) 7.5 mL 0   No current facility-administered medications on file prior to visit.  [2] No Known Allergies  "

## 2024-08-24 ENCOUNTER — Ambulatory Visit

## 2024-08-24 DIAGNOSIS — R102 Pelvic and perineal pain unspecified side: Secondary | ICD-10-CM | POA: Diagnosis not present

## 2024-08-24 DIAGNOSIS — Z01419 Encounter for gynecological examination (general) (routine) without abnormal findings: Secondary | ICD-10-CM

## 2024-08-24 LAB — CYTOLOGY - PAP
Comment: NEGATIVE
Diagnosis: NEGATIVE
High risk HPV: NEGATIVE

## 2024-08-25 LAB — URINE CULTURE: Organism ID, Bacteria: NO GROWTH

## 2024-08-26 ENCOUNTER — Ambulatory Visit: Payer: Self-pay | Admitting: Obstetrics and Gynecology

## 2024-09-06 ENCOUNTER — Encounter: Payer: Self-pay | Admitting: Obstetrics and Gynecology

## 2024-09-06 ENCOUNTER — Ambulatory Visit: Admitting: Obstetrics and Gynecology

## 2024-09-06 VITALS — BP 122/75 | HR 90 | Ht 60.0 in | Wt 160.0 lb

## 2024-09-06 DIAGNOSIS — Z30433 Encounter for removal and reinsertion of intrauterine contraceptive device: Secondary | ICD-10-CM

## 2024-09-06 MED ORDER — LEVONORGESTREL 20 MCG/DAY IU IUD
1.0000 | INTRAUTERINE_SYSTEM | Freq: Once | INTRAUTERINE | Status: AC
  Administered 2024-09-06: 1 via INTRAUTERINE

## 2024-09-06 NOTE — Progress Notes (Signed)
" ° ° °  GYNECOLOGY OFFICE PROCEDURE NOTE  Tylesha Gibeault is a 34 y.o. G0P0000 here for Mirena  IUD removal and reinsertion. No GYN concerns.  Last pap smear was on 08/23/2024 and was normal.  IUD Removal and Reinsertion  Patient identified, informed consent performed, consent signed.   Chaperone present.   Discussed risks of irregular bleeding, cramping, infection, malpositioning or misplacement of the IUD outside the uterus which may require further procedures. Also discussed >99% contraception efficacy, increased risk of ectopic pregnancy with failure of method.   Emphasized that this did not protect against STIs, condoms recommended during all sexual encounters.Advised to use backup contraception for one week as the risk of pregnancy is higher during the transition period of removing an IUD and replacing it with another one. Time out was performed. Speculum placed in the vagina. The strings of the IUD were grasped and pulled using ring forceps. The IUD was successfully removed in its entirety. The cervix was cleaned with Betadine x 2 and grasped anteriorly with a single tooth tenaculum.  The new Mirena  IUD insertion apparatus was used to sound the uterus to 7 cm;  the IUD was then placed per manufacturer's recommendations. Strings trimmed to 3 cm. Tenaculum was removed, good hemostasis noted. Patient tolerated procedure well.   Patient was given post-procedure instructions.  She was reminded to have backup contraception for one week during this transition period between IUDs.  Patient was also asked to check IUD strings periodically and follow up in 4 weeks for IUD check.  Baltazar Pekala, Delon FERNS, NP [facility] [employer], [facility]  "

## 2024-10-04 ENCOUNTER — Ambulatory Visit: Admitting: Obstetrics and Gynecology

## 2024-10-25 ENCOUNTER — Encounter: Payer: 59 | Admitting: Nurse Practitioner
